# Patient Record
Sex: Female | Born: 1970
Health system: Southern US, Community
[De-identification: ages and names within clinical notes are randomized; demographics above are authoritative.]

## PROBLEM LIST (undated history)

## (undated) DIAGNOSIS — I10 Essential (primary) hypertension: Secondary | ICD-10-CM

## (undated) DIAGNOSIS — M545 Low back pain, unspecified: Secondary | ICD-10-CM

## (undated) DIAGNOSIS — M25519 Pain in unspecified shoulder: Secondary | ICD-10-CM

## (undated) DIAGNOSIS — I8390 Asymptomatic varicose veins of unspecified lower extremity: Secondary | ICD-10-CM

## (undated) DIAGNOSIS — Z8719 Personal history of other diseases of the digestive system: Secondary | ICD-10-CM

## (undated) DIAGNOSIS — N6001 Solitary cyst of right breast: Secondary | ICD-10-CM

## (undated) DIAGNOSIS — K219 Gastro-esophageal reflux disease without esophagitis: Secondary | ICD-10-CM

## (undated) DIAGNOSIS — G8929 Other chronic pain: Secondary | ICD-10-CM

## (undated) HISTORY — DX: Gastro-esophageal reflux disease without esophagitis: K21.9

## (undated) HISTORY — DX: Other chronic pain: G89.29

## (undated) HISTORY — PX: ANTERIOR CRUCIATE LIGAMENT REPAIR: SHX115

## (undated) HISTORY — PX: DILATION AND CURETTAGE OF UTERUS: SHX78

## (undated) HISTORY — DX: Personal history of other diseases of the digestive system: Z87.19

## (undated) HISTORY — DX: Pain in unspecified shoulder: M25.519

## (undated) HISTORY — DX: Asymptomatic varicose veins of unspecified lower extremity: I83.90

## (undated) HISTORY — PX: OTHER SURGICAL HISTORY: SHX169

## (undated) HISTORY — PX: KNEE CARTILAGE SURGERY: SHX688

## (undated) HISTORY — DX: Low back pain, unspecified: M54.50

## (undated) HISTORY — DX: Solitary cyst of right breast: N60.01

## (undated) HISTORY — DX: Low back pain: M54.5

---

## 1995-01-05 HISTORY — PX: TUBAL LIGATION: SHX77

## 2001-01-08 ENCOUNTER — Emergency Department (HOSPITAL_COMMUNITY): Admission: EM | Admit: 2001-01-08 | Discharge: 2001-01-08 | Payer: Self-pay | Admitting: Emergency Medicine

## 2001-01-10 ENCOUNTER — Emergency Department (HOSPITAL_COMMUNITY): Admission: EM | Admit: 2001-01-10 | Discharge: 2001-01-10 | Payer: Self-pay | Admitting: *Deleted

## 2002-08-26 ENCOUNTER — Emergency Department (HOSPITAL_COMMUNITY): Admission: EM | Admit: 2002-08-26 | Discharge: 2002-08-26 | Payer: Self-pay | Admitting: Emergency Medicine

## 2004-01-30 ENCOUNTER — Emergency Department (HOSPITAL_COMMUNITY): Admission: EM | Admit: 2004-01-30 | Discharge: 2004-01-30 | Payer: Self-pay | Admitting: Emergency Medicine

## 2006-04-13 ENCOUNTER — Ambulatory Visit (HOSPITAL_COMMUNITY): Admission: RE | Admit: 2006-04-13 | Discharge: 2006-04-13 | Payer: Self-pay | Admitting: Obstetrics and Gynecology

## 2010-08-18 ENCOUNTER — Other Ambulatory Visit (HOSPITAL_COMMUNITY): Payer: Self-pay | Admitting: Pulmonary Disease

## 2010-08-18 DIAGNOSIS — R300 Dysuria: Secondary | ICD-10-CM

## 2010-08-19 ENCOUNTER — Other Ambulatory Visit (HOSPITAL_COMMUNITY): Payer: Self-pay

## 2010-08-20 ENCOUNTER — Ambulatory Visit (HOSPITAL_COMMUNITY)
Admission: RE | Admit: 2010-08-20 | Discharge: 2010-08-20 | Disposition: A | Discharge status: Home / Self Care | Payer: 59 | Source: Ambulatory Visit | Attending: Pulmonary Disease | Admitting: Pulmonary Disease

## 2010-08-20 DIAGNOSIS — N390 Urinary tract infection, site not specified: Secondary | ICD-10-CM | POA: Insufficient documentation

## 2010-08-20 DIAGNOSIS — R1031 Right lower quadrant pain: Secondary | ICD-10-CM | POA: Insufficient documentation

## 2010-08-20 DIAGNOSIS — R1032 Left lower quadrant pain: Secondary | ICD-10-CM | POA: Insufficient documentation

## 2010-08-20 DIAGNOSIS — M545 Low back pain, unspecified: Secondary | ICD-10-CM | POA: Insufficient documentation

## 2010-08-20 DIAGNOSIS — R300 Dysuria: Secondary | ICD-10-CM

## 2010-09-10 ENCOUNTER — Encounter: Payer: Self-pay | Admitting: Vascular Surgery

## 2010-09-15 ENCOUNTER — Encounter: Payer: Self-pay | Admitting: Vascular Surgery

## 2010-09-16 ENCOUNTER — Ambulatory Visit (INDEPENDENT_AMBULATORY_CARE_PROVIDER_SITE_OTHER): Payer: 59 | Admitting: Vascular Surgery

## 2010-09-16 ENCOUNTER — Encounter: Payer: Self-pay | Admitting: Vascular Surgery

## 2010-09-16 ENCOUNTER — Ambulatory Visit (INDEPENDENT_AMBULATORY_CARE_PROVIDER_SITE_OTHER): Payer: 59 | Admitting: *Deleted

## 2010-09-16 VITALS — BP 142/85 | HR 60 | Resp 16 | Ht 64.0 in | Wt 178.0 lb

## 2010-09-16 DIAGNOSIS — I83893 Varicose veins of bilateral lower extremities with other complications: Secondary | ICD-10-CM

## 2010-09-16 DIAGNOSIS — M79609 Pain in unspecified limb: Secondary | ICD-10-CM

## 2010-09-16 DIAGNOSIS — I83899 Varicose veins of unspecified lower extremities with other complications: Secondary | ICD-10-CM

## 2010-09-16 NOTE — Progress Notes (Signed)
The patient presents today for evaluation of left leg venous pathology. She is concerned regarding painful veins over her left anterior thigh and also large plexus of telangiectasia of her left lateral calf. He has no history of DVT or superficial thrombophlebitis or bleeding. She works in 10-12 hour shift and reports that on the evening following the she does have discomfort over the area of her anterior thigh. He does not have any significant swelling and has only scattered stet spider vein telangiectasia on her right leg. He has sensation is described as aching over her anterior thigh and is relieved with elevation. She has no significant lower extremity swelling.  Past Medical History  Diagnosis Date  . Chest pain   . Chronic pain in shoulder   . GERD (gastroesophageal reflux disease)   . Low back pain   . History of gastrointestinal bleeding   . Varicose veins  painful varicosities left leg    History  Substance Use Topics  . Smoking status: Current Everyday Smoker -- 0.5 packs/day  . Smokeless tobacco: Not on file  . Alcohol Use: No    Family History  Problem Relation Age of Onset  . Cancer Father   . Cancer Maternal Grandmother     No Known Allergies  Current outpatient prescriptions:ibuprofen (ADVIL,MOTRIN) 800 MG tablet, Take 800 mg by mouth as needed.  , Disp: , Rfl: ;  omeprazole (PRILOSEC) 20 MG capsule, Take 20 mg by mouth daily.  , Disp: , Rfl:   BP 142/85  Pulse 60  Resp 16  Ht 5\' 4"  (1.626 m)  Wt 178 lb (80.74 kg)  BMI 30.55 kg/m2  Body mass index is 30.55 kg/(m^2).        Review of systems positive for weight gain, occasional chest pain, gastroesophageal reflux, headache, joint pain all other review of systems negative.  Physical exam: Well-developed well-nourished white female appearing stated age in no acute distress. HEENT is normal. 2+ radial and 2+ dorsalis pedis pulses bilaterally. Musculoskeletal no major forms or cyanosis area neurologic no focal  weakness paresthesias. Skin without ulcers or rashes. Psychiatric normal affect. He does have a large nest of plangent ectasia of the lateral aspect of her left calf. He has lubing reticulator with anterior left thigh.  Venous vascular lab ultrasound: No evidence of DVT and no evidence of left leg venous reflux. She has mild insignificant reflux and a small segment of her left great saphenous vein.  Impression and plan: I discussed the significance of all this at length with [name]. That this does not under any increased risk for more serious venous issues such as DVT. I did explain treatment for both the telangiectasia and bleeding articulators would be potential sclerotherapy if these were causing her a significant concern. I explained that this would not be covered by insurance and a for an estimate of out-of-pocket expense. She will consider this was reassured with the discussion at this was not dangerous.

## 2010-09-23 NOTE — Procedures (Unsigned)
LOWER EXTREMITY VENOUS REFLUX EXAM  INDICATION:  Left leg varicose veins.  EXAM:  Using color-flow imaging and pulse Doppler spectral analysis, the left common femoral, superficial femoral, popliteal, posterior tibial, greater and lesser saphenous veins are evaluated.  There is no evidence suggesting deep venous insufficiency in the left lower extremity.  The left saphenofemoral junction is competent.  The left nontortuous GSV demonstrates reflux of >523milliseconds focally at the mid thigh region.  The left proximal small saphenous vein demonstrates competency.  GSV Diameter (used if found to be incompetent only)                                                   Right     Left Proximal Greater Saphenous Vein                   cm        0.53 cm  Mid thigh                                         cm        0.43 cm  Distal thigh                                      cm        0.26 cm Knee                                              cm        0.35 cm  IMPRESSION:  Left greater saphenous vein reflux is noted, as described above.  ___________________________________________ Larina Earthly, M.D.  CH/MEDQ  D:  09/17/2010  T:  09/17/2010  Job:  [id number]

## 2010-12-01 ENCOUNTER — Other Ambulatory Visit (HOSPITAL_COMMUNITY): Payer: Self-pay | Admitting: Pulmonary Disease

## 2010-12-01 DIAGNOSIS — Z139 Encounter for screening, unspecified: Secondary | ICD-10-CM

## 2010-12-10 ENCOUNTER — Ambulatory Visit (HOSPITAL_COMMUNITY)
Admission: RE | Admit: 2010-12-10 | Discharge: 2010-12-10 | Disposition: A | Discharge status: Home / Self Care | Payer: 59 | Source: Ambulatory Visit | Attending: Pulmonary Disease | Admitting: Pulmonary Disease

## 2010-12-10 DIAGNOSIS — Z1231 Encounter for screening mammogram for malignant neoplasm of breast: Secondary | ICD-10-CM | POA: Insufficient documentation

## 2010-12-10 DIAGNOSIS — Z139 Encounter for screening, unspecified: Secondary | ICD-10-CM

## 2011-02-25 ENCOUNTER — Other Ambulatory Visit (HOSPITAL_COMMUNITY)
Admission: RE | Admit: 2011-02-25 | Discharge: 2011-02-25 | Disposition: A | Discharge status: Home / Self Care | Payer: 59 | Source: Ambulatory Visit | Attending: Obstetrics & Gynecology | Admitting: Obstetrics & Gynecology

## 2011-02-25 DIAGNOSIS — Z01419 Encounter for gynecological examination (general) (routine) without abnormal findings: Secondary | ICD-10-CM | POA: Insufficient documentation

## 2011-04-08 ENCOUNTER — Other Ambulatory Visit (HOSPITAL_COMMUNITY): Payer: Self-pay | Admitting: Pulmonary Disease

## 2011-04-08 ENCOUNTER — Ambulatory Visit (HOSPITAL_COMMUNITY): Payer: 59

## 2011-04-08 ENCOUNTER — Ambulatory Visit (HOSPITAL_COMMUNITY)
Admission: RE | Admit: 2011-04-08 | Discharge: 2011-04-08 | Disposition: A | Discharge status: Home / Self Care | Payer: 59 | Source: Ambulatory Visit | Attending: Pulmonary Disease | Admitting: Pulmonary Disease

## 2011-04-08 DIAGNOSIS — M25519 Pain in unspecified shoulder: Secondary | ICD-10-CM

## 2011-04-22 ENCOUNTER — Encounter: Payer: Self-pay | Admitting: Orthopedic Surgery

## 2011-04-22 ENCOUNTER — Ambulatory Visit (INDEPENDENT_AMBULATORY_CARE_PROVIDER_SITE_OTHER): Payer: 59 | Admitting: Orthopedic Surgery

## 2011-04-22 VITALS — BP 140/80 | Ht 64.0 in | Wt 183.0 lb

## 2011-04-22 DIAGNOSIS — M7551 Bursitis of right shoulder: Secondary | ICD-10-CM

## 2011-04-22 DIAGNOSIS — M25511 Pain in right shoulder: Secondary | ICD-10-CM | POA: Insufficient documentation

## 2011-04-22 DIAGNOSIS — M719 Bursopathy, unspecified: Secondary | ICD-10-CM

## 2011-04-22 DIAGNOSIS — M25519 Pain in unspecified shoulder: Secondary | ICD-10-CM

## 2011-04-22 DIAGNOSIS — M67919 Unspecified disorder of synovium and tendon, unspecified shoulder: Secondary | ICD-10-CM

## 2011-04-22 NOTE — Patient Instructions (Signed)
You will have a therapy visit for home exercise program

## 2011-04-22 NOTE — Progress Notes (Signed)
  Subjective:    Kimberly Fitzpatrick is a 41 y.o. female who presents with 3-1/2 week history of right shoulder pain which came on suddenly without any history of trauma. She complains of sharp dull throbbing stabbing 5/10 intermittent pain which is better after she took a prednisone Dosepak  A consult has been requested by Dr. Kari Baars  No previous history of rotator cuff symptoms she does complain of some catching  She complains of some heartburn her review of systems is otherwise normal  Physical Exam(12) GENERAL: normal development   CDV: pulses are normal   Skin: normal  Lymph: nodes were not palpable/normal  Psychiatric: awake, alert and oriented  Neuro: normal sensation  Examination right shoulder shows full passive and active range of motion. She has grade 5 strength in her rotator cuff. The shoulder stable in abduction and external rotation. There is mild tenderness around a acromial deltoid region. Her impingement sign was positive for Hawkins sign was positive.  Left shoulder full range of motion. Strength is normal. Stability normal. No swelling.     Assessment:    Right Shoulder rotator cuff tendinitis and bursitis    Plan:    Natural history and expected course discussed. Questions answered. Shoulder injection. See procedure note. Physical therapy referral. If she is not in proved after 6 weeks then she is to call to get another appointment possible MRI to be scheduled at that time  Subacromial Shoulder Injection Procedure Note  Shoulder Injection Procedure Note   Pre-operative Diagnosis: right  RC Syndrome  Post-operative Diagnosis: same  Indications: pain   Anesthesia: ethyl chloride   Procedure Details   Verbal consent was obtained for the procedure. The shoulder was prepped withalcohol and the skin was anesthetized. A 20 gauge needle was advanced into the subacromial space through posterior approach without difficulty  The space was then  injected with 3 ml 1% lidocaine and 1 ml of depomedrol. The injection site was cleansed with isopropyl alcohol and a dressing was applied.  Complications:  None; patient tolerated the procedure well.

## 2011-04-26 ENCOUNTER — Other Ambulatory Visit: Payer: Self-pay | Admitting: *Deleted

## 2011-04-26 MED ORDER — HYDROCODONE-ACETAMINOPHEN 5-325 MG PO TABS: 1.0000 | ORAL_TABLET | Freq: Four times a day (QID) | ORAL | Status: AC | PRN

## 2011-05-03 ENCOUNTER — Other Ambulatory Visit: Payer: Self-pay | Admitting: Obstetrics & Gynecology

## 2011-05-04 ENCOUNTER — Encounter (HOSPITAL_COMMUNITY): Payer: Self-pay | Admitting: Pharmacy Technician

## 2011-05-05 ENCOUNTER — Encounter (HOSPITAL_COMMUNITY): Payer: Self-pay

## 2011-05-05 ENCOUNTER — Encounter (HOSPITAL_COMMUNITY)
Admission: RE | Admit: 2011-05-05 | Discharge: 2011-05-05 | Disposition: A | Discharge status: Home / Self Care | Payer: 59 | Source: Ambulatory Visit | Attending: Obstetrics & Gynecology | Admitting: Obstetrics & Gynecology

## 2011-05-05 ENCOUNTER — Other Ambulatory Visit: Payer: Self-pay

## 2011-05-05 HISTORY — DX: Essential (primary) hypertension: I10

## 2011-05-05 LAB — COMPREHENSIVE METABOLIC PANEL
ALT: 17 U/L (ref 0–35)
AST: 17 U/L (ref 0–37)
Albumin: 4.3 g/dL (ref 3.5–5.2)
Alkaline Phosphatase: 52 U/L (ref 39–117)
Chloride: 101 mEq/L (ref 96–112)
Potassium: 4.4 mEq/L (ref 3.5–5.1)
Sodium: 137 mEq/L (ref 135–145)
Total Bilirubin: 0.5 mg/dL (ref 0.3–1.2)
Total Protein: 7.3 g/dL (ref 6.0–8.3)

## 2011-05-05 LAB — CBC
HCT: 44.8 % (ref 36.0–46.0)
MCHC: 34.4 g/dL (ref 30.0–36.0)
Platelets: 272 10*3/uL (ref 150–400)
RDW: 12.8 % (ref 11.5–15.5)
WBC: 9.4 10*3/uL (ref 4.0–10.5)

## 2011-05-05 LAB — URINE MICROSCOPIC-ADD ON

## 2011-05-05 LAB — URINALYSIS, ROUTINE W REFLEX MICROSCOPIC
Bilirubin Urine: NEGATIVE
Glucose, UA: NEGATIVE mg/dL
Ketones, ur: NEGATIVE mg/dL
Nitrite: NEGATIVE
Specific Gravity, Urine: <1.005 — ABNORMAL LOW (ref 1.005–1.030)
pH: 6 (ref 5.0–8.0)

## 2011-05-05 LAB — SURGICAL PCR SCREEN: Staphylococcus aureus: NEGATIVE

## 2011-05-05 LAB — HCG, QUANTITATIVE, PREGNANCY: hCG, Beta Chain, Quant, S: <1 m[IU]/mL (ref <5)

## 2011-05-05 NOTE — Patient Instructions (Addendum)
Your procedure is scheduled on:  05/12/2011  Report to [facility] at   7:00   AM.  Call this number if you have problems the morning of surgery: [phone]   Remember:   Do not drink or eat food:After Midnight.    Clear liquids include soda, tea, black coffee, apple or grape juice, broth.  Take these medicines the morning of surgery with A SIP OF WATER: Benicar   Do not wear jewelry, make-up or nail polish.  Do not wear lotions, powders, or perfumes. You may wear deodorant.  Do not shave 48 hours prior to surgery.  Do not bring valuables to the hospital.  Contacts, dentures or bridgework may not be worn into surgery.  Leave suitcase in the car. After surgery it may be brought to your room.  For patients admitted to the hospital, checkout time is 11:00 AM the day of discharge.   Patients discharged the day of surgery will not be allowed to drive home.  Name and phone number of your driver:   Special Instructions: CHG Shower Shower 2 days before surgery and 1 day before surgery with Hibiclens.   Please read over the following fact sheets that you were given: Pain Booklet, Surgical Site Infection Prevention, Anesthesia Post-op Instructions and Care and Recovery After Surgery

## 2011-05-06 ENCOUNTER — Ambulatory Visit (HOSPITAL_COMMUNITY)
Admission: RE | Admit: 2011-05-06 | Discharge: 2011-05-06 | Disposition: A | Discharge status: Home / Self Care | Payer: 59 | Source: Ambulatory Visit | Attending: Orthopedic Surgery | Admitting: Orthopedic Surgery

## 2011-05-06 DIAGNOSIS — M7551 Bursitis of right shoulder: Secondary | ICD-10-CM

## 2011-05-06 DIAGNOSIS — IMO0001 Reserved for inherently not codable concepts without codable children: Secondary | ICD-10-CM | POA: Insufficient documentation

## 2011-05-06 DIAGNOSIS — M6281 Muscle weakness (generalized): Secondary | ICD-10-CM | POA: Insufficient documentation

## 2011-05-06 DIAGNOSIS — M25519 Pain in unspecified shoulder: Secondary | ICD-10-CM | POA: Insufficient documentation

## 2011-05-06 DIAGNOSIS — M25511 Pain in right shoulder: Secondary | ICD-10-CM

## 2011-05-06 NOTE — Evaluation (Signed)
Occupational Therapy Evaluation  Patient Details  Name: Kimberly Fitzpatrick MRN: [id number] Date of Birth: 04-09-1970  Today's Date: 05/06/2011 Time: 1120-1205 OT Time Calculation (min): 45 min OT Eval 1120-1145 25' Manual THerapy 4098-1191 20' Visit#: 1  of 1   Re-eval:   N/A Assessment Diagnosis: Right Shoulder Bursitis Prior Therapy: No   Past Medical History:  Past Medical History  Diagnosis Date  . Chest pain   . Chronic pain in shoulder   . GERD (gastroesophageal reflux disease)   . Low back pain   . History of gastrointestinal bleeding   . Varicose veins  painful varicosities left leg  . Hypertension    Past Surgical History:  Past Surgical History  Procedure Date  . Knee cartilage surgery right knee 1987   right knee 1999  . Anterior cruciate ligament repair right knee  1999  . Tubal ligation 1997    Subjective Symptoms/Limitations Symptoms: S:  My right shoulder has been hurting for about 4 weeks.  After I had the cortisone injection, it has felt about 80% better. Limitations: History:  Ms. Kimberly Fitzpatrick had bilateral shoulder pain several years ago that dissipated on its own accord.  Approximately 4 weeks ago, she began experiencing increased pain and limited mobility in her right shoulder region .  She consulted with her primary care MD and was referred to Dr. Romeo Apple.  She was given a cortisone injection that has alleviated 80% of her symptoms.  She has been referred to occupational therapy for evaluation and treatment.  She has had an xray which was negative. 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 Leisure: Hobbies-yes (Comment)  Assessment ADL/Vision/Perception ADL ADL Comments: Right hand dominant.  Fastening her bra and putting her right arm into a coat sleeve is painful. Dominant Hand: Right Vision - History Baseline Vision: No visual deficits  Cognition/Observation Cognition Overall Cognitive Status: Appears within functional limits for tasks assessed  Sensation/Coordination/Edema Sensation Light Touch: Appears Intact Coordination Gross Motor Movements are Fluid and Coordinated: Yes Fine Motor Movements are Fluid and Coordinated: Yes  Additional Assessments RUE Assessment RUE Assessment: Within Functional Limits Palpation Palpation: Min-mod fascial restrictions and muscle tightness in her scapular region.     Exercise/Treatments Manual Therapy Manual Therapy: Myofascial release Myofascial Release: MFR and manual stretching to right upper arm and scapular region to decrease pain and increase right arm mobility.  [id number]  Occupational Therapy Assessment and Plan OT Assessment and Plan Clinical Impression Statement: A:  41 year old female presents with increased pain in her right shoulder due to bursitis, causing decreased independence with activiites at home and work involving internal rotation. Pt will benefit from skilled therapeutic intervention in order to improve on the following deficits: Pain;Increased fascial restricitons Rehab Potential: Excellent OT Frequency: Min 1X/week OT Duration:  (one week) OT Treatment/Interventions: Patient/family education;Manual therapy OT Plan: One time visit for educated on HEP completed this date.   Goals Short Term Goals Time to Complete Short Term Goals: 2 weeks Short Term Goal 1: Patient will be educated on a HEP. Short Term Goal 1  Progress: Met  Problem List Patient  Active Problem List  Diagnoses  . Right shoulder pain  . Bursitis of right shoulder    End of Session Activity Tolerance: Patient tolerated treatment well General Behavior During Session: [facility] for tasks performed Cognition: [facility] for tasks performed OT Plan of Care OT Home Exercise Plan: Educated patient on HEP for cervical and shoulder stretches and scapular tband exercises. Consulted and Agree with Plan of Care: Patient  Shirlean Mylar, OTR/L  05/06/2011, 12:54 PM  Physician Documentation Your signature is required to indicate approval of the treatment plan as stated above.  Please sign and either send electronically or make a copy of this report for your files and return this physician signed original.  Please mark one 1.__approve of plan  2. ___approve of plan with the following conditions.   ______________________________                                                          _____________________ Physician Signature                                                                                                             Date

## 2011-05-12 ENCOUNTER — Encounter (HOSPITAL_COMMUNITY): Payer: Self-pay | Admitting: Anesthesiology

## 2011-05-12 ENCOUNTER — Encounter (HOSPITAL_COMMUNITY)
Admission: RE | Disposition: A | Discharge status: Home / Self Care | Payer: Self-pay | Source: Ambulatory Visit | Attending: Obstetrics & Gynecology

## 2011-05-12 ENCOUNTER — Encounter (HOSPITAL_COMMUNITY): Payer: Self-pay | Admitting: *Deleted

## 2011-05-12 ENCOUNTER — Ambulatory Visit (HOSPITAL_COMMUNITY): Payer: 59 | Admitting: Anesthesiology

## 2011-05-12 ENCOUNTER — Ambulatory Visit (HOSPITAL_COMMUNITY)
Admission: RE | Admit: 2011-05-12 | Discharge: 2011-05-12 | Disposition: A | Discharge status: Home / Self Care | Payer: 59 | Source: Ambulatory Visit | Attending: Obstetrics & Gynecology | Admitting: Obstetrics & Gynecology

## 2011-05-12 DIAGNOSIS — Z01812 Encounter for preprocedural laboratory examination: Secondary | ICD-10-CM | POA: Insufficient documentation

## 2011-05-12 DIAGNOSIS — N946 Dysmenorrhea, unspecified: Secondary | ICD-10-CM | POA: Insufficient documentation

## 2011-05-12 DIAGNOSIS — N920 Excessive and frequent menstruation with regular cycle: Secondary | ICD-10-CM | POA: Insufficient documentation

## 2011-05-12 DIAGNOSIS — Z9889 Other specified postprocedural states: Secondary | ICD-10-CM

## 2011-05-12 SURGERY — DILATATION & CURETTAGE/HYSTEROSCOPY WITH THERMACHOICE ABLATION
Anesthesia: General | Wound class: Clean Contaminated

## 2011-05-12 MED ORDER — PROPOFOL 10 MG/ML IV EMUL
INTRAVENOUS | Status: AC
  Filled 2011-05-12: qty 20

## 2011-05-12 MED ORDER — MIDAZOLAM HCL 5 MG/5ML IJ SOLN
INTRAMUSCULAR | Status: DC | PRN
  Administered 2011-05-12: 2 mg via INTRAVENOUS

## 2011-05-12 MED ORDER — SUCCINYLCHOLINE CHLORIDE 20 MG/ML IJ SOLN
INTRAMUSCULAR | Status: AC
  Filled 2011-05-12: qty 1

## 2011-05-12 MED ORDER — DEXTROSE 5 % IV SOLN
INTRAVENOUS | Status: DC | PRN
  Administered 2011-05-12: 22 mL via INTRAVENOUS

## 2011-05-12 MED ORDER — PROPOFOL 10 MG/ML IV BOLUS
INTRAVENOUS | Status: DC | PRN
  Administered 2011-05-12: 150 mg via INTRAVENOUS

## 2011-05-12 MED ORDER — FENTANYL CITRATE 0.05 MG/ML IJ SOLN
INTRAMUSCULAR | Status: AC
  Administered 2011-05-12: 50 ug via INTRAVENOUS
  Filled 2011-05-12: qty 2

## 2011-05-12 MED ORDER — EPHEDRINE SULFATE 50 MG/ML IJ SOLN
INTRAMUSCULAR | Status: DC | PRN
  Administered 2011-05-12: 10 mg via INTRAVENOUS

## 2011-05-12 MED ORDER — SODIUM CHLORIDE 0.9 % IR SOLN
Status: DC | PRN
  Administered 2011-05-12: 3000 mL

## 2011-05-12 MED ORDER — ONDANSETRON HCL 4 MG/2ML IJ SOLN
4.0000 mg | Freq: Once | INTRAMUSCULAR | Status: AC
  Administered 2011-05-12: 4 mg via INTRAVENOUS

## 2011-05-12 MED ORDER — ROCURONIUM BROMIDE 50 MG/5ML IV SOLN
INTRAVENOUS | Status: AC
  Filled 2011-05-12: qty 1

## 2011-05-12 MED ORDER — CEFAZOLIN SODIUM 1-5 GM-% IV SOLN
INTRAVENOUS | Status: AC
  Filled 2011-05-12: qty 50

## 2011-05-12 MED ORDER — KETOROLAC TROMETHAMINE 10 MG PO TABS: 10.0000 mg | ORAL_TABLET | Freq: Three times a day (TID) | ORAL | Status: AC | PRN

## 2011-05-12 MED ORDER — LIDOCAINE HCL 1 % IJ SOLN
INTRAMUSCULAR | Status: DC | PRN
  Administered 2011-05-12: 50 mg via INTRADERMAL

## 2011-05-12 MED ORDER — MIDAZOLAM HCL 2 MG/2ML IJ SOLN
INTRAMUSCULAR | Status: AC
  Filled 2011-05-12: qty 2

## 2011-05-12 MED ORDER — SODIUM CHLORIDE 0.9 % IR SOLN
Status: DC | PRN
  Administered 2011-05-12: 1000 mL

## 2011-05-12 MED ORDER — CEFAZOLIN SODIUM 1-5 GM-% IV SOLN: 1.0000 g | INTRAVENOUS | Status: DC

## 2011-05-12 MED ORDER — ONDANSETRON HCL 8 MG PO TABS: 8.0000 mg | ORAL_TABLET | Freq: Three times a day (TID) | ORAL | Status: AC | PRN

## 2011-05-12 MED ORDER — ONDANSETRON HCL 4 MG/2ML IJ SOLN: 4.0000 mg | Freq: Once | INTRAMUSCULAR | Status: DC | PRN

## 2011-05-12 MED ORDER — FENTANYL CITRATE 0.05 MG/ML IJ SOLN
25.0000 ug | INTRAMUSCULAR | Status: DC | PRN
  Administered 2011-05-12 (×4): 50 ug via INTRAVENOUS

## 2011-05-12 MED ORDER — KETOROLAC TROMETHAMINE 30 MG/ML IJ SOLN
INTRAMUSCULAR | Status: AC
  Administered 2011-05-12: 30 mg via INTRAVENOUS
  Filled 2011-05-12: qty 1

## 2011-05-12 MED ORDER — ROCURONIUM BROMIDE 100 MG/10ML IV SOLN
INTRAVENOUS | Status: DC | PRN
  Administered 2011-05-12: 5 mg via INTRAVENOUS

## 2011-05-12 MED ORDER — FENTANYL CITRATE 0.05 MG/ML IJ SOLN
INTRAMUSCULAR | Status: DC | PRN
  Administered 2011-05-12 (×2): 50 ug via INTRAVENOUS

## 2011-05-12 MED ORDER — CEFAZOLIN SODIUM 1-5 GM-% IV SOLN
INTRAVENOUS | Status: DC | PRN
  Administered 2011-05-12: 1 g via INTRAVENOUS

## 2011-05-12 MED ORDER — LIDOCAINE HCL (PF) 1 % IJ SOLN
INTRAMUSCULAR | Status: AC
  Filled 2011-05-12: qty 5

## 2011-05-12 MED ORDER — ONDANSETRON HCL 4 MG/2ML IJ SOLN
INTRAMUSCULAR | Status: AC
  Administered 2011-05-12: 4 mg via INTRAVENOUS
  Filled 2011-05-12: qty 2

## 2011-05-12 MED ORDER — KETOROLAC TROMETHAMINE 30 MG/ML IJ SOLN
30.0000 mg | Freq: Once | INTRAMUSCULAR | Status: AC
  Administered 2011-05-12: 30 mg via INTRAVENOUS

## 2011-05-12 MED ORDER — LACTATED RINGERS IV SOLN
INTRAVENOUS | Status: DC
  Administered 2011-05-12: 1000 mL via INTRAVENOUS

## 2011-05-12 MED ORDER — SUCCINYLCHOLINE CHLORIDE 20 MG/ML IJ SOLN
INTRAMUSCULAR | Status: DC | PRN
  Administered 2011-05-12: 160 mg via INTRAVENOUS

## 2011-05-12 MED ORDER — MIDAZOLAM HCL 2 MG/2ML IJ SOLN
1.0000 mg | INTRAMUSCULAR | Status: DC | PRN
Start: 2011-05-12 — End: 2011-05-12
  Administered 2011-05-12 (×2): 2 mg via INTRAVENOUS

## 2011-05-12 MED ORDER — MIDAZOLAM HCL 2 MG/2ML IJ SOLN
INTRAMUSCULAR | Status: AC
  Administered 2011-05-12: 2 mg via INTRAVENOUS
  Filled 2011-05-12: qty 2

## 2011-05-12 SURGICAL SUPPLY — 28 items
BAG DECANTER FOR FLEXI CONT (MISCELLANEOUS) ×2 IMPLANT
BAG HAMPER (MISCELLANEOUS) ×2 IMPLANT
CATH THERMACHOICE III (CATHETERS) ×2 IMPLANT
CLOTH BEACON ORANGE TIMEOUT ST (SAFETY) ×2 IMPLANT
COVER SURGICAL LIGHT HANDLE (MISCELLANEOUS) ×4 IMPLANT
FORMALIN 10 PREFIL 120ML (MISCELLANEOUS) ×2 IMPLANT
GAUZE SPONGE 4X4 16PLY XRAY LF (GAUZE/BANDAGES/DRESSINGS) ×2 IMPLANT
GLOVE BIOGEL PI IND STRL 8 (GLOVE) ×1 IMPLANT
GLOVE BIOGEL PI INDICATOR 8 (GLOVE) ×1
GLOVE ECLIPSE 6.5 STRL STRAW (GLOVE) ×2 IMPLANT
GLOVE ECLIPSE 8.0 STRL XLNG CF (GLOVE) ×2 IMPLANT
GLOVE INDICATOR 7.0 STRL GRN (GLOVE) ×4 IMPLANT
GOWN STRL REIN XL XLG (GOWN DISPOSABLE) ×4 IMPLANT
INST SET HYSTEROSCOPY (KITS) ×2 IMPLANT
IV D5W 500ML (IV SOLUTION) ×2 IMPLANT
IV NS IRRIG 3000ML ARTHROMATIC (IV SOLUTION) ×2 IMPLANT
KIT ROOM TURNOVER APOR (KITS) ×2 IMPLANT
MANIFOLD NEPTUNE II (INSTRUMENTS) ×2 IMPLANT
MARKER SKIN DUAL TIP RULER LAB (MISCELLANEOUS) ×2 IMPLANT
NS IRRIG 1000ML POUR BTL (IV SOLUTION) ×2 IMPLANT
PACK BASIC III (CUSTOM PROCEDURE TRAY) ×1
PACK SRG BSC III STRL LF ECLPS (CUSTOM PROCEDURE TRAY) ×1 IMPLANT
PAD ARMBOARD 7.5X6 YLW CONV (MISCELLANEOUS) ×2 IMPLANT
PAD TELFA 3X4 1S STER (GAUZE/BANDAGES/DRESSINGS) ×2 IMPLANT
SET BASIN LINEN APH (SET/KITS/TRAYS/PACK) ×2 IMPLANT
SET IRRIG Y TYPE TUR BLADDER L (SET/KITS/TRAYS/PACK) ×2 IMPLANT
SHEET LAVH (DRAPES) ×2 IMPLANT
YANKAUER SUCT BULB TIP 10FT TU (MISCELLANEOUS) ×2 IMPLANT

## 2011-05-12 NOTE — Transfer of Care (Signed)
Immediate Anesthesia Transfer of Care Note  Patient: Kimberly Fitzpatrick  Procedure(s) Performed: Procedure(s) (LRB): DILATATION & CURETTAGE/HYSTEROSCOPY WITH THERMACHOICE ABLATION (N/A)  Patient Location: PACU  Anesthesia Type: General  Level of Consciousness: awake, alert  and patient cooperative  Airway & Oxygen Therapy: Patient Spontanous Breathing and Patient connected to face mask oxygen  Post-op Assessment: Report given to PACU RN, Post -op Vital signs reviewed and stable and Patient moving all extremities  Post vital signs: Reviewed and stable  Complications: No apparent anesthesia complications

## 2011-05-12 NOTE — Anesthesia Preprocedure Evaluation (Addendum)
Anesthesia Evaluation  Patient identified by MRN, date of birth, ID band Patient awake    Reviewed: Allergy & Precautions, H&P , NPO status , Patient's Chart, lab work & pertinent test results  History of Anesthesia Complications Negative for: history of anesthetic complications  Airway Mallampati: II TM Distance: >3 FB     Dental  (+) Teeth Intact and Poor Dentition   Pulmonary Current Smoker,  breath sounds clear to auscultation        Cardiovascular hypertension, Pt. on medications Rhythm:Regular     Neuro/Psych    GI/Hepatic GERD-  Medicated and Controlled,  Endo/Other    Renal/GU      Musculoskeletal  (+) Arthritis - (chronic LBP),   Abdominal   Peds  Hematology   Anesthesia Other Findings   Reproductive/Obstetrics                           Anesthesia Physical Anesthesia Plan  ASA: II  Anesthesia Plan: General   Post-op Pain Management:    Induction: Intravenous, Rapid sequence and Cricoid pressure planned  Airway Management Planned: Oral ETT  Additional Equipment:   Intra-op Plan:   Post-operative Plan: Extubation in OR  Informed Consent: I have reviewed the patients History and Physical, chart, labs and discussed the procedure including the risks, benefits and alternatives for the proposed anesthesia with the patient or authorized representative who has indicated his/her understanding and acceptance.     Plan Discussed with:   Anesthesia Plan Comments:         Anesthesia Quick Evaluation

## 2011-05-12 NOTE — H&P (Signed)
Kimberly Fitzpatrick is an 41 y.o. female status post tubal ligation with increasing menometrorrhagia and dysmenorrhea.  Responded to megestrol.  Sonogram normal.  No pain other than with menses.  No dyspareunia.  For endometrial ablation.     Past Medical History  Diagnosis Date  . Chest pain   . Chronic pain in shoulder   . GERD (gastroesophageal reflux disease)   . Low back pain   . History of gastrointestinal bleeding   . Varicose veins  painful varicosities left leg  . Hypertension     Past Surgical History  Procedure Date  . Knee cartilage surgery right knee 1987   right knee 1999  . Anterior cruciate ligament repair right knee  1999  . Tubal ligation 1997    Family History  Problem Relation Age of Onset  . Cancer Father   . Cancer Maternal Grandmother     Social History:  reports that she has been smoking.  She does not have any smokeless tobacco history on file. She reports that she does not drink alcohol or use illicit drugs.  Allergies: No Known Allergies  Prescriptions prior to admission  Medication Sig Dispense Refill  . gabapentin (NEURONTIN) 300 MG capsule Take 300 mg by mouth 3 (three) times daily.      Marland Kitchen HYDROcodone-acetaminophen (NORCO) 5-325 MG per tablet Take 1 tablet by mouth every 6 (six) hours as needed. For pain      . megestrol (MEGACE) 40 MG tablet Take 40 mg by mouth daily.      Marland Kitchen olmesartan (BENICAR) 40 MG tablet Take 40 mg by mouth daily.      Marland Kitchen omeprazole (PRILOSEC) 20 MG capsule Take 20 mg by mouth daily.          ROS  Review of Systems  Constitutional: Negative for fever, chills, weight loss, malaise/fatigue and diaphoresis.  HENT: Negative for hearing loss, ear pain, nosebleeds, congestion, sore throat, neck pain, tinnitus and ear discharge.   Eyes: Negative for blurred vision, double vision, photophobia, pain, discharge and redness.  Respiratory: Negative for cough, hemoptysis, sputum production, shortness of breath, wheezing and stridor.     Cardiovascular: Negative for chest pain, palpitations, orthopnea, claudication, leg swelling and PND.  Gastrointestinal: Negative for abdominal pain. Negative for heartburn, nausea, vomiting, diarrhea, constipation, blood in stool and melena.  Genitourinary: Negative for dysuria, urgency, frequency, hematuria and flank pain.  Musculoskeletal: Negative for myalgias, back pain, joint pain and falls.  Skin: Negative for itching and rash.  Neurological: Negative for dizziness, tingling, tremors, sensory change, speech change, focal weakness, seizures, loss of consciousness, weakness and headaches.  Endo/Heme/Allergies: Negative for environmental allergies and polydipsia. Does not bruise/bleed easily.  Psychiatric/Behavioral: Negative for depression, suicidal ideas, hallucinations, memory loss and substance abuse. The patient is not nervous/anxious and does not have insomnia.      Blood pressure 136/82, pulse 72, temperature 98.1 F (36.7 C), temperature source Oral, resp. rate 23, last menstrual period 02/16/2011, SpO2 95.00%. Physical Exam Physical Exam  Vitals reviewed. Constitutional: She is oriented to person, place, and time. She appears well-developed and well-nourished.  HENT:  Head: Normocephalic and atraumatic.  Right Ear: External ear normal.  Left Ear: External ear normal.  Nose: Nose normal.  Mouth/Throat: Oropharynx is clear and moist.  Eyes: Conjunctivae and EOM are normal. Pupils are equal, round, and reactive to light. Right eye exhibits no discharge. Left eye exhibits no discharge. No scleral icterus.  Neck: Normal range of motion. Neck supple. No tracheal deviation  present. No thyromegaly present.  Cardiovascular: Normal rate, regular rhythm, normal heart sounds and intact distal pulses.  Exam reveals no gallop and no friction rub.   No murmur heard. Respiratory: Effort normal and breath sounds normal. No respiratory distress. She has no wheezes. She has no rales. She  exhibits no tenderness.  GI: Soft. Bowel sounds are normal. She exhibits no distension and no mass. There is tenderness. There is no rebound and no guarding.  Genitourinary:       Vulva is normal without lesions Vagina is pink moist without discharge Cervix normal in appearance and pap is normal Uterus is normal by sonogram Adnexa is negative with normal sized ovaries by sonogram  Musculoskeletal: Normal range of motion. She exhibits no edema and no tenderness.  Neurological: She is alert and oriented to person, place, and time. She has normal reflexes. She displays normal reflexes. No cranial nerve deficit. She exhibits normal muscle tone. Coordination normal.  Skin: Skin is warm and dry. No rash noted. No erythema. No pallor.  Psychiatric: She has a normal mood and affect. Her behavior is normal. Judgment and thought content normal.   Recent Results (from the past 336 hour(s))  URINALYSIS, ROUTINE W REFLEX MICROSCOPIC   Collection Time   05/05/11 11:49 AM      Component Value Range   Color, Urine YELLOW  YELLOW    APPearance CLEAR  CLEAR    Specific Gravity, Urine <1.005 (*) 1.005 - 1.030    pH 6.0  5.0 - 8.0    Glucose, UA NEGATIVE  NEGATIVE (mg/dL)   Hgb urine dipstick TRACE (*) NEGATIVE    Bilirubin Urine NEGATIVE  NEGATIVE    Ketones, ur NEGATIVE  NEGATIVE (mg/dL)   Protein, ur NEGATIVE  NEGATIVE (mg/dL)   Urobilinogen, UA 0.2  0.0 - 1.0 (mg/dL)   Nitrite NEGATIVE  NEGATIVE    Leukocytes, UA TRACE (*) NEGATIVE   URINE MICROSCOPIC-ADD ON   Collection Time   05/05/11 11:49 AM      Component Value Range   Squamous Epithelial / LPF FEW (*) RARE    WBC, UA 3-6  <3 (WBC/hpf)   RBC / HPF 0-2  <3 (RBC/hpf)   Bacteria, UA FEW (*) RARE   CBC   Collection Time   05/05/11 12:30 PM      Component Value Range   WBC 9.4  4.0 - 10.5 (K/uL)   RBC 5.03  3.87 - 5.11 (MIL/uL)   Hemoglobin 15.4 (*) 12.0 - 15.0 (g/dL)   HCT 13.0  86.5 - 78.4 (%)   MCV 89.1  78.0 - 100.0 (fL)   MCH 30.6  26.0  - 34.0 (pg)   MCHC 34.4  30.0 - 36.0 (g/dL)   RDW 69.6  29.5 - 28.4 (%)   Platelets 272  150 - 400 (K/uL)  COMPREHENSIVE METABOLIC PANEL   Collection Time   05/05/11 12:30 PM      Component Value Range   Sodium 137  135 - 145 (mEq/L)   Potassium 4.4  3.5 - 5.1 (mEq/L)   Chloride 101  96 - 112 (mEq/L)   CO2 26  19 - 32 (mEq/L)   Glucose, Bld 97  70 - 99 (mg/dL)   BUN 14  6 - 23 (mg/dL)   Creatinine, Ser 1.32  0.50 - 1.10 (mg/dL)   Calcium 44.0  8.4 - 10.5 (mg/dL)   Total Protein 7.3  6.0 - 8.3 (g/dL)   Albumin 4.3  3.5 - 5.2 (g/dL)  AST 17  0 - 37 (U/L)   ALT 17  0 - 35 (U/L)   Alkaline Phosphatase 52  39 - 117 (U/L)   Total Bilirubin 0.5  0.3 - 1.2 (mg/dL)   GFR calc non Af Amer 87 (*) >90 (mL/min)   GFR calc Af Amer >90  >90 (mL/min)  HCG, QUANTITATIVE, PREGNANCY   Collection Time   05/05/11 12:30 PM      Component Value Range   hCG, Beta Chain, Quant, S <1  <5 (mIU/mL)  SURGICAL PCR SCREEN   Collection Time   05/05/11 12:36 PM      Component Value Range   MRSA, PCR NEGATIVE  NEGATIVE    Staphylococcus aureus NEGATIVE  NEGATIVE      Assessment/Plan: 1.  Menometrorrhagia 2.  Dysmenorrhea  Patient for hysteroscopy curettage ablation she understands risks and risk of failure.   Ethyl Vila H 05/12/2011, 9:40 AM

## 2011-05-12 NOTE — Discharge Instructions (Signed)
Endometrial Ablation Endometrial ablation removes the lining of the uterus (endometrium). It is usually a same day, outpatient treatment. Ablation helps avoid major surgery (such as a hysterectomy). A hysterectomy is removal of the cervix and uterus. Endometrial ablation has less risk and complications, has a shorter recovery period and is less expensive. After endometrial ablation, most women will have little or no menstrual bleeding. You may not keep your fertility. Pregnancy is no longer likely after this procedure but if you are pre-menopausal, you still need to use a reliable method of birth control following the procedure because pregnancy can occur. REASONS TO HAVE THE PROCEDURE MAY INCLUDE:  Heavy periods.   Bleeding that is causing anemia.   Anovulatory bleeding, very irregular, bleeding.   Bleeding submucous fibroids (on the lining inside the uterus) if they are smaller than 3 centimeters.  REASONS NOT TO HAVE THE PROCEDURE MAY INCLUDE:  You wish to have more children.   You have a pre-cancerous or cancerous problem. The cause of any abnormal bleeding must be diagnosed before having the procedure.   You have pain coming from the uterus.   You have a submucus fibroid larger than 3 centimeters.   You recently had a baby.   You recently had an infection in the uterus.   You have a severe retro-flexed, tipped uterus and cannot insert the instrument to do the ablation.   You had a Cesarean section or deep major surgery on the uterus.   The inner cavity of the uterus is too large for the endometrial ablation instrument.  RISKS AND COMPLICATIONS   Perforation of the uterus.   Bleeding.   Infection of the uterus, bladder or vagina.   Injury to surrounding organs.   Cutting the cervix.   An air bubble to the lung (air embolus).   Pregnancy following the procedure.   Failure of the procedure to help the problem requiring hysterectomy.   Decreased ability to diagnose  cancer in the lining of the uterus.  BEFORE THE PROCEDURE  The lining of the uterus must be tested to make sure there is no pre-cancerous or cancer cells present.   Medications may be given to make the lining of the uterus thinner.   Ultrasound may be used to evaluate the size and look for abnormalities of the uterus.   Future pregnancy is not desired.  PROCEDURE  There are different ways to destroy the lining of the uterus.   Resectoscope - radio frequency-alternating electric current is the most common one used.   Cryotherapy - freezing the lining of the uterus.   Heated Free Liquid - heated salt (saline) solution inserted into the uterus.   Microwave - uses high energy microwaves in the uterus.   Thermal Balloon - a catheter with a balloon tip is inserted into the uterus and filled with heated fluid.  Your caregiver will talk with you about the method used in this clinic. They will also instruct you on the pros and cons of the procedure. Endometrial ablation is performed along with a procedure called operative hysteroscopy. A narrow viewing tube is inserted through the birth canal (vagina) and through the cervix into the uterus. A tiny camera attached to the viewing tube (hysteroscope) allows the uterine cavity to be shown on a TV monitor during surgery. Your uterus is filled with a harmless liquid to make the procedure easier. The lining of the uterus is then removed. The lining can also be removed with a resectoscope which allows your surgeon   to cut away the lining of the uterus under direct vision. Usually, you will be able to go home within an hour after the procedure. HOME CARE INSTRUCTIONS   Do not drive for 24 hours.   No tampons, douching or intercourse for 2 weeks or until your caregiver approves.   Rest at home for 24 to 48 hours. You may then resume normal activities unless told differently by your caregiver.   Take your temperature two times a day for 4 days, and record  it.   Take any medications your caregiver has ordered, as directed.   Use some form of contraception if you are pre-menopausal and do not want to get pregnant.  Bleeding after the procedure is normal. It varies from light spotting and mildly watery to bloody discharge for 4 to 6 weeks. You may also have mild cramping. Only take over-the-counter or prescription medicines for pain, discomfort, or fever as directed by your caregiver. Do not use aspirin, as this may aggravate bleeding. Frequent urination during the first 24 hours is normal. You will not know how effective your surgery is until at least 3 months after the surgery. SEEK IMMEDIATE MEDICAL CARE IF:   Bleeding is heavier than a normal menstrual cycle.   An oral temperature above 102 F (38.9 C) develops.   You have increasing cramps or pains not relieved with medication or develop belly (abdominal) pain which does not seem to be related to the same area of earlier cramping and pain.   You are light headed, weak or have fainting episodes.   You develop pain in the shoulder strap areas.   You have chest or leg pain.   You have abnormal vaginal discharge.   You have painful urination.  Document Released: 10/31/2003 Document Revised: 12/10/2010 Document Reviewed: 01/28/2007 ExitCare Patient Information 2012 [employer]. 

## 2011-05-12 NOTE — Anesthesia Postprocedure Evaluation (Signed)
  Anesthesia Post-op Note  Patient: Kimberly Fitzpatrick  Procedure(s) Performed: Procedure(s) (LRB): DILATATION & CURETTAGE/HYSTEROSCOPY WITH THERMACHOICE ABLATION (N/A)  Patient Location: PACU  Anesthesia Type: General  Level of Consciousness: awake, alert , oriented and patient cooperative  Airway and Oxygen Therapy: Patient Spontanous Breathing  Post-op Pain: 3 /10, mild  Post-op Assessment: Post-op Vital signs reviewed, Patient's Cardiovascular Status Stable, Respiratory Function Stable, Patent Airway, No signs of Nausea or vomiting and Pain level controlled  Post-op Vital Signs: Reviewed and stable  Complications: No apparent anesthesia complications

## 2011-05-12 NOTE — Anesthesia Procedure Notes (Signed)
Procedure Name: Intubation Date/Time: 05/12/2011 10:15 AM Performed by: Despina Hidden Pre-anesthesia Checklist: Suction available, Emergency Drugs available, Patient being monitored and Patient identified Patient Re-evaluated:Patient Re-evaluated prior to inductionOxygen Delivery Method: Circle system utilized Preoxygenation: Pre-oxygenation with 100% oxygen Intubation Type: IV induction, Cricoid Pressure applied and Rapid sequence Ventilation: Mask ventilation without difficulty Laryngoscope Size: 3 and Mac Grade View: Grade I Tube type: Oral Tube size: 7.0 mm Number of attempts: 1 Airway Equipment and Method: Stylet Placement Confirmation: breath sounds checked- equal and bilateral,  ETT inserted through vocal cords under direct vision and positive ETCO2 Secured at: 22 cm Tube secured with: Tape Dental Injury: Teeth and Oropharynx as per pre-operative assessment

## 2011-05-12 NOTE — Op Note (Signed)
Preoperative diagnosis: Menometrorrhagia                                        Dysmenorrhea   Postoperative diagnoses: Same as above   Procedure: Hysteroscopy, uterine curettage, endometrial ablation  Surgeon: Despina Hidden MD  Anesthesia: Laryngeal mask airway  Findings: The endometrium was normal. There were no fibroid or other abnormalities.  Description of operation: The patient was taken to the operating room and placed in the supine position. She underwent general anesthesia using the laryngeal mask airway. She was placed in the dorsal lithotomy position and prepped and draped in the usual sterile fashion. A Graves speculum was placed and the anterior cervical lip was grasped with a single-tooth tenaculum. The cervix was dilated serially to allow passage of the hysteroscope. Diagnostic hysteroscopy was performed and was found to be normal. A vigorous uterine curettage was then performed and all tissue sent to pathology for evaluation. The ThermaChoice 3 endometrial ablation balloon was then used were 22 cc of D5W was required to maintain a pressure of 190-200 mm of mercury throughout the procedure. Toatl therapy time was 9:58.  All of the equipment worked well throughout the procedure. All of the fluid was returned at the end of the procedure. The patient was awakened from anesthesia and taken to the recovery room in good stable condition all counts were correct. She received 1 g of Ancef and 30 mg of Toradol preoperatively. She will be discharged from the recovery room and followed up in the office in 2 weeks.  Kimberly Fitzpatrick H 10:51 AM 05/12/2011

## 2012-09-27 ENCOUNTER — Ambulatory Visit (HOSPITAL_COMMUNITY)
Admission: RE | Admit: 2012-09-27 | Discharge: 2012-09-27 | Disposition: A | Discharge status: Home / Self Care | Payer: 59 | Source: Ambulatory Visit | Attending: Pulmonary Disease | Admitting: Pulmonary Disease

## 2012-09-27 ENCOUNTER — Other Ambulatory Visit (HOSPITAL_COMMUNITY): Payer: Self-pay | Admitting: Pulmonary Disease

## 2012-09-27 DIAGNOSIS — M25569 Pain in unspecified knee: Secondary | ICD-10-CM | POA: Insufficient documentation

## 2012-09-27 DIAGNOSIS — G8929 Other chronic pain: Secondary | ICD-10-CM

## 2013-01-08 ENCOUNTER — Other Ambulatory Visit (HOSPITAL_COMMUNITY): Payer: Self-pay | Admitting: Pulmonary Disease

## 2013-01-08 DIAGNOSIS — Z139 Encounter for screening, unspecified: Secondary | ICD-10-CM

## 2013-01-15 ENCOUNTER — Ambulatory Visit (HOSPITAL_COMMUNITY)
Admission: RE | Admit: 2013-01-15 | Discharge: 2013-01-15 | Disposition: A | Discharge status: Home / Self Care | Payer: 59 | Source: Ambulatory Visit | Attending: Pulmonary Disease | Admitting: Pulmonary Disease

## 2013-01-15 DIAGNOSIS — Z139 Encounter for screening, unspecified: Secondary | ICD-10-CM

## 2013-01-15 DIAGNOSIS — Z1231 Encounter for screening mammogram for malignant neoplasm of breast: Secondary | ICD-10-CM | POA: Insufficient documentation

## 2013-03-21 ENCOUNTER — Inpatient Hospital Stay: Payer: 59 | Admitting: Adult Health

## 2014-01-31 ENCOUNTER — Ambulatory Visit (INDEPENDENT_AMBULATORY_CARE_PROVIDER_SITE_OTHER): Payer: 59

## 2014-01-31 ENCOUNTER — Encounter: Payer: Self-pay | Admitting: Orthopedic Surgery

## 2014-01-31 ENCOUNTER — Ambulatory Visit (INDEPENDENT_AMBULATORY_CARE_PROVIDER_SITE_OTHER): Payer: 59 | Admitting: Orthopedic Surgery

## 2014-01-31 VITALS — BP 174/102 | Ht 64.0 in | Wt 188.0 lb

## 2014-01-31 DIAGNOSIS — M25511 Pain in right shoulder: Secondary | ICD-10-CM

## 2014-01-31 DIAGNOSIS — M75101 Unspecified rotator cuff tear or rupture of right shoulder, not specified as traumatic: Secondary | ICD-10-CM

## 2014-02-01 ENCOUNTER — Encounter: Payer: Self-pay | Admitting: Orthopedic Surgery

## 2014-02-01 NOTE — Progress Notes (Signed)
Patient ID: Kimberly Fitzpatrick, female   DOB: February 08, 1970, 44 y.o.   MRN: [id number] Chief Complaint  Patient presents with  . Shoulder Pain    Right shoulder pain, no injury.    44 year old female previous history of right rotator cuff syndrome treated with subacromial injection did well for approximately 2-1/2 years. She then began having pain over the right shoulder joint, difficulty sleeping at night, painful [place] elevation and weakness in the right shoulder without any history of trauma  She is here for repeat evaluation  Review of systems reveals no neck pain no tenderness in the cervical spine region no lower back pain. No numbness or tingling.  Past Medical History  Diagnosis Date  . Chest pain   . Chronic pain in shoulder   . GERD (gastroesophageal reflux disease)   . Low back pain   . History of gastrointestinal bleeding   . Varicose veins  painful varicosities left leg  . Hypertension      BP 174/102 mmHg  Ht 5\' 4"  (1.626 m)  Wt 188 lb (85.276 kg)  BMI 32.25 kg/m2 Normal development grooming and hygiene. She is awake alert and oriented 3. Gait and station are normal. Cervical spine is nontender with full range of motion.  Left shoulder motion is normal stability tests were normal muscle tone and strength were normal in grade 5  Right shoulder shows tenderness in the subacromial joint lateral deltoid area. Painful range of motion past 120 of flexion with full passive flexion decreased internal rotation with pain and tightness in the front of the shoulder joint. An abduction external rotation she is stable. Rotator cuff strength grade 5.  Axillary lymph nodes are normal  Normal sensation in the right hand good distal pulse in the radial and ulnar artery  X-rays show cyst formation in the greater tuberosity region glenohumeral joint is normal acromion is type II graft impression rotator cuff syndrome  Repeat injection  If no improvement after 2 weeks please call the  office to arrange further evaluation     Procedure note the subacromial injection shoulder RIGHT  Verbal consent was obtained to inject the  RIGHT   Shoulder  Timeout was completed to confirm the injection site is a subacromial space of the  RIGHT  shoulder   Medication used Depo-Medrol 40 mg and lidocaine 1% 3 cc  Anesthesia was provided by ethyl chloride  The injection was performed in the RIGHT  posterior subacromial space. After pinning the skin with alcohol and anesthetized the skin with ethyl chloride the subacromial space was injected using a 20-gauge needle. There were no complications  Sterile dressing was applied.

## 2014-05-07 ENCOUNTER — Other Ambulatory Visit (HOSPITAL_COMMUNITY): Payer: Self-pay | Admitting: Pulmonary Disease

## 2014-05-07 DIAGNOSIS — Z1231 Encounter for screening mammogram for malignant neoplasm of breast: Secondary | ICD-10-CM

## 2014-05-20 ENCOUNTER — Ambulatory Visit (HOSPITAL_COMMUNITY)
Admission: RE | Admit: 2014-05-20 | Discharge: 2014-05-20 | Disposition: A | Discharge status: Home / Self Care | Payer: 59 | Source: Ambulatory Visit | Attending: Pulmonary Disease | Admitting: Pulmonary Disease

## 2014-05-20 DIAGNOSIS — Z1231 Encounter for screening mammogram for malignant neoplasm of breast: Secondary | ICD-10-CM | POA: Insufficient documentation

## 2014-12-06 ENCOUNTER — Ambulatory Visit (HOSPITAL_COMMUNITY): Payer: Managed Care, Other (non HMO) | Attending: Physical Medicine and Rehabilitation | Admitting: Physical Therapy

## 2014-12-06 DIAGNOSIS — R262 Difficulty in walking, not elsewhere classified: Secondary | ICD-10-CM | POA: Insufficient documentation

## 2014-12-06 DIAGNOSIS — M5442 Lumbago with sciatica, left side: Secondary | ICD-10-CM | POA: Insufficient documentation

## 2014-12-06 NOTE — Patient Instructions (Signed)
Bridging    Slowly raise buttocks from floor, keeping stomach tight. Repeat __10__ times per set. Do ___1_ sets per session. Do __3__ sessions per day.  http://orth.exer.us/1096   Copyright  VHI. All rights reserved.  Straight Leg Raise (Prone)    Abdomen and head supported, keep left knee locked and raise leg at hip. Avoid arching low back. Repeat _10__ times per set. Do ___1_ sets per session. Do __3__ sessions per day.  http://orth.exer.us/1112   Copyright  VHI. All rights reserved.

## 2014-12-06 NOTE — Therapy (Signed)
[facility] [place], [place], [address] Phone: [phone]   Fax:  [phone]  Physical Therapy Evaluation  Patient Details  Name: Kimberly Fitzpatrick MRN: [id number] Date of Birth: 1970-01-23 Referring Provider: Darnelle Going  Encounter Date: 12/06/2014      PT End of Session - 12/06/14 1601    Visit Number 1   Number of Visits 4   Date for PT Re-Evaluation 01/05/15   Authorization Type [employer]   Authorization - Visit Number 1   Authorization - Number of Visits 4   PT Start Time 1520   PT Stop Time 1559   PT Time Calculation (min) 39 min   Activity Tolerance Patient tolerated treatment well      Past Medical History  Diagnosis Date  . Chest pain   . Chronic pain in shoulder   . GERD (gastroesophageal reflux disease)   . Low back pain   . History of gastrointestinal bleeding   . Varicose veins  painful varicosities left leg  . Hypertension     Past Surgical History  Procedure Laterality Date  . Knee cartilage surgery  right knee 1987   right knee 1999  . Anterior cruciate ligament repair  right knee  1999  . Tubal ligation  1997    There were no vitals filed for this visit.  Visit Diagnosis:  Left-sided low back pain with left-sided sciatica - Plan: PT plan of care cert/re-cert  Difficulty walking - Plan: PT plan of care cert/re-cert      Subjective Assessment - 12/06/14 1524    Subjective Ms. Rosana Berger states that she has been having some back and Lt hip pain for years.   She had an injection in her Lt SI last week.  She states that she is 50% better now compared to what it was.  She states at this time she is painfree.     How long can you sit comfortably? able to sit for only short periods of time    How long can you stand comfortably? Pt can stand for 5-10 minutes    How long can you walk comfortably? walking is fine sometimes other times it bothers her .    Currently in Pain? No/denies  Worst pain she has  had has been a 9/10             [facility] PT Assessment - 12/06/14 0001    Assessment   Medical Diagnosis Lt SI dysfunction   Referring Provider Ibeezebo   Onset Date/Surgical Date 11/21/14   Next MD Visit 12/24/2014   Prior Therapy not for this    Balance Screen   Has the patient fallen in the past 6 months No   Has the patient had a decrease in activity level because of a fear of falling?  No   Is the patient reluctant to leave their home because of a fear of falling?  No   Prior Function   Level of Independence Independent   Vocation Full time employment   Vocation Requirements on feet all day    Leisure none   Cognition   Overall Cognitive Status Within Functional Limits for tasks assessed   Observation/Other Assessments   Focus on Therapeutic Outcomes (FOTO)  66   Posture/Postural Control   Posture/Postural Control Postural limitations   Postural Limitations Decreased lumbar lordosis;Decreased thoracic kyphosis   Posture Comments Lt iliac crest high, Lt PSIS high; Lt ASIS low indicative of anterior rotation  ROM / Strength   AROM / PROM / Strength AROM;Strength   AROM   AROM Assessment Site Lumbar   Lumbar Flexion decreased 20 %    Lumbar Extension decreased 30%    Strength   Strength Assessment Site Hip;Knee;Ankle   Right/Left Hip Right;Left   Right Hip Flexion 5/5   Right Hip Extension 4/5   Right Hip ABduction 5/5   Left Hip Flexion 5/5   Left Hip Extension 4-/5   Left Hip ABduction 5/5   Right/Left Knee Right;Left   Right Knee Extension 5/5   Left Knee Extension 5/5   Right/Left Ankle Right;Left   Right Ankle Dorsiflexion 5/5   Left Ankle Dorsiflexion 5/5   Palpation   SI assessment  --  Lt inominant is anteriorly rotated.                    [facility] Adult PT Treatment/Exercise - 12/06/14 0001    Exercises   Exercises Lumbar   Lumbar Exercises: Supine   Bridge 10 reps   Lumbar Exercises: Prone   Straight Leg Raise 5 reps   Manual Therapy    Manual Therapy Muscle Energy Technique   Muscle Energy Technique for LT SI                 PT Education - 12/06/14 1600    Education provided Yes   Education Details SI self mobilization and gluteal strengthening    Person(s) Educated Patient   Methods Explanation;Verbal cues;Handout   Comprehension Verbalized understanding;Returned demonstration          PT Short Term Goals - 12/06/14 1615    PT SHORT TERM GOAL #1   Title Pt to be I in HEP to keep SI in proper alignment to decrease pain    Time 2   Period Weeks           PT Long Term Goals - 12/06/14 1616    PT LONG TERM GOAL #1   Title Pt to be I in advance HEP to improve pelvic stabilization so her SI jt does not rotate to prevent future pain    Time 4   Period Weeks   PT LONG TERM GOAL #2   Title Pt to be able to sit with comfort for 2 hours to see a movie   Time 6   Period Weeks   PT LONG TERM GOAL #3   Title Pt to be able to stand for an hour to make an in depth meal  and to have decreased pain at work to improve her quality of life.    Time 6   PT LONG TERM GOAL #4   Title Pt to state  that she is completing alll ADL's with no pain and is I in an exercise program to improve her quality of life.    Time 8   Period Weeks               Plan - 12/06/14 1606    Clinical Impression Statement [name] is a 44 yo female who has had low back pain on the left side for years.  A MRI was completed that showed Lt sacrilitis.  She had an injection in the Lt side on November 17th which improved her pain significantly.  She is now being referred to skilled physcial therapy for pelvic stabilization and strengthing exercises.  Examination demonstrates that her Lt SI jt is posteriorly rotated.  Using mm energy techniques she was easily  aligned.  Exam also demonstrate weakned gluteal maximus mm Bilaterally.  Ms. Rosana Berger witll benefit from skilled PT to address these issues and maximize her functioning ability to  improve her quality of life.     Pt will benefit from skilled therapeutic intervention in order to improve on the following deficits Decreased activity tolerance;Pain;Difficulty walking;Decreased strength   Rehab Potential Good   PT Frequency --  every other week x 4 weeks.    PT Treatment/Interventions ADLs/Self Care Home Management;Therapeutic exercise;Patient/family education   PT Next Visit Plan Due to pt just going to a new insurance and having a high deductable requirement Ms. Rosana Berger would prefer to complete most of her therapy at home.  Next visit check SI and gluteal strength.  Add all 4 opposite arm leg raise.  Lt  Hip abduction and extension in modified long sitting postition.    PT Home Exercise Plan given for SI mobilization , bridges and prone hip extension.    Consulted and Agree with Plan of Care Patient         Problem List Patient Active Problem List   Diagnosis Date Noted  . Right shoulder pain 04/22/2011  . Bursitis of right shoulder 04/22/2011    Rayetta Humphrey, PT CLT [phone] 12/06/2014, 4:22 PM  [facility] [address] [place], [place], [address] Phone: [phone]   Fax:  [phone]  Name: TYRIELLE VALLAS MRN: [id number] Date of Birth: [birth year]

## 2014-12-20 ENCOUNTER — Encounter (HOSPITAL_COMMUNITY): Payer: 59

## 2015-01-03 ENCOUNTER — Ambulatory Visit (HOSPITAL_COMMUNITY): Payer: Managed Care, Other (non HMO)

## 2015-01-03 ENCOUNTER — Telehealth (HOSPITAL_COMMUNITY): Payer: Self-pay

## 2015-01-03 NOTE — Telephone Encounter (Signed)
No show, tried to call about missed apt. both home numbers that are wrong numbers, called mobile and mail box full.  [address] [place]; [place] [phone]

## 2015-01-15 ENCOUNTER — Ambulatory Visit (HOSPITAL_COMMUNITY): Payer: Managed Care, Other (non HMO) | Attending: Physical Medicine and Rehabilitation | Admitting: Physical Therapy

## 2015-01-15 ENCOUNTER — Telehealth (HOSPITAL_COMMUNITY): Payer: Self-pay | Admitting: Physical Therapy

## 2015-01-15 NOTE — Telephone Encounter (Signed)
Patient had her second no-show today. Called and explained policy, reminded patient of time/date of next session.  Deniece Ree PT, DPT [phone]

## 2015-01-30 ENCOUNTER — Ambulatory Visit (HOSPITAL_COMMUNITY): Payer: Managed Care, Other (non HMO) | Admitting: Physical Therapy

## 2015-01-30 ENCOUNTER — Telehealth (HOSPITAL_COMMUNITY): Payer: Self-pay | Admitting: Physical Therapy

## 2015-01-30 NOTE — Telephone Encounter (Signed)
Patient had her third no-show today. Attempted to call home number today; family member answered and stated that PT could not leave message for patient. Attempted to call patient's cell phone but mailbox full. Will follow up by sending letter to patient's home detailing that she is being discharged at this time due to 3 consecutive no-shows.  Deniece Ree PT, DPT [phone]

## 2015-01-30 NOTE — Therapy (Signed)
[facility] [place], [place], [address] Phone: [phone]   Fax:  [phone]  Patient Details  Name: Kimberly Fitzpatrick MRN: [id number] Date of Birth: 16-Oct-1970 Referring Provider:  Laroy Apple, MD  Encounter Date: 01/30/2015   PHYSICAL THERAPY DISCHARGE SUMMARY  Visits from Start of Care: 1  Current functional level related to goals / functional outcomes: Patient has not returned since last visit despite contact from PT team regarding time/date of upcoming sessions. She has reached 3 consecutive no-shows and is being discharged per clinic policy.    Remaining deficits: Unable to assess    Education / Equipment: Unable to assess  Plan: Patient agrees to discharge.  Patient goals were not met. Patient is being discharged due to not returning since the last visit.  ?????       Deniece Ree PT, DPT [facility] [address] [place], [place], [address] Phone: [phone]   Fax:  [phone]

## 2015-03-26 ENCOUNTER — Other Ambulatory Visit (HOSPITAL_COMMUNITY): Payer: Self-pay | Admitting: Pulmonary Disease

## 2015-03-26 DIAGNOSIS — R1032 Left lower quadrant pain: Secondary | ICD-10-CM

## 2015-04-01 ENCOUNTER — Encounter (INDEPENDENT_AMBULATORY_CARE_PROVIDER_SITE_OTHER): Payer: Self-pay | Admitting: *Deleted

## 2015-04-01 ENCOUNTER — Ambulatory Visit (HOSPITAL_COMMUNITY)
Admission: RE | Admit: 2015-04-01 | Discharge: 2015-04-01 | Disposition: A | Discharge status: Home / Self Care | Payer: Managed Care, Other (non HMO) | Source: Ambulatory Visit | Attending: Pulmonary Disease | Admitting: Pulmonary Disease

## 2015-04-01 ENCOUNTER — Ambulatory Visit (HOSPITAL_COMMUNITY): Payer: Managed Care, Other (non HMO)

## 2015-04-01 DIAGNOSIS — R1032 Left lower quadrant pain: Secondary | ICD-10-CM | POA: Diagnosis present

## 2015-04-01 DIAGNOSIS — M461 Sacroiliitis, not elsewhere classified: Secondary | ICD-10-CM | POA: Insufficient documentation

## 2015-04-01 DIAGNOSIS — N83202 Unspecified ovarian cyst, left side: Secondary | ICD-10-CM | POA: Insufficient documentation

## 2015-04-01 MED ORDER — IOHEXOL 300 MG/ML  SOLN
100.0000 mL | Freq: Once | INTRAMUSCULAR | Status: AC | PRN
  Administered 2015-04-01: 100 mL via INTRAVENOUS

## 2015-04-10 ENCOUNTER — Encounter (HOSPITAL_COMMUNITY): Payer: Self-pay

## 2015-04-14 ENCOUNTER — Ambulatory Visit (INDEPENDENT_AMBULATORY_CARE_PROVIDER_SITE_OTHER): Payer: Self-pay | Admitting: Internal Medicine

## 2015-04-18 ENCOUNTER — Encounter (HOSPITAL_COMMUNITY): Payer: Self-pay

## 2015-04-23 ENCOUNTER — Encounter (INDEPENDENT_AMBULATORY_CARE_PROVIDER_SITE_OTHER): Payer: Self-pay | Admitting: *Deleted

## 2015-04-23 ENCOUNTER — Encounter (INDEPENDENT_AMBULATORY_CARE_PROVIDER_SITE_OTHER): Payer: Self-pay | Admitting: Internal Medicine

## 2015-04-23 ENCOUNTER — Other Ambulatory Visit (INDEPENDENT_AMBULATORY_CARE_PROVIDER_SITE_OTHER): Payer: Self-pay | Admitting: Internal Medicine

## 2015-04-23 ENCOUNTER — Ambulatory Visit (INDEPENDENT_AMBULATORY_CARE_PROVIDER_SITE_OTHER): Payer: Managed Care, Other (non HMO) | Admitting: Internal Medicine

## 2015-04-23 VITALS — BP 142/48 | HR 72 | Temp 98.3°F | Ht 64.0 in | Wt 180.8 lb

## 2015-04-23 DIAGNOSIS — K625 Hemorrhage of anus and rectum: Secondary | ICD-10-CM | POA: Diagnosis not present

## 2015-04-23 NOTE — Progress Notes (Addendum)
Subjective:    Patient ID: Kimberly Fitzpatrick, female    DOB: 12/11/1970, 45 y.o.   MRN: [id number] Patient states she is not hard to sedate and she does not take the Hydrocodone everyday.  Rarely takes Xanax  HPI Referred by Dr .Luan Pulling for rectal bleeding. Hx of rectal bleeding x 9 years. She has seen blood in the toilet. She has had a colonoscopy years ago for same 8-9 yrs ago. She reports it was normal. She says she has had pain in her left upper quadrant at times. Sometimes she will feel a "knot' in her abdomen. Her appetite is good. No weight loss. No dysphagia. She has acid reflux and is controlled with Omeprazole. She has a BM x 3 a day. Stools are nice and formed. No diarrhea.  No melena . She had rectal bleeding x 5 days in a a row several weeks ago. She has not seen any since.  No family hx of colon cancer. She takes Meloxicam daily.  11/13/2007 Colonoscopy: Dr. Anthony Sar: rectal bleeding. No active bleeding noted. Internal hemorrhoids. Normal colonoscopy otherwise.   03/27/2015 H and H  15/6 and 45.7. MCV 90.9  04/01/2015 CT abdomen/pelvis with CM.      CLINICAL DATA: LEFT lower quadrant pain off and on for 9 years, bright red blood in stool off and on for 9-10 years, has a knot in her LEFT lower quadrant when she has the pain.  EXAM: CT ABDOMEN AND PELVIS WITH CONTRAST  IMPRESSION: LEFT ovarian cyst 3.5 x 3.5 x 3.9 cm.  No other intra-abdominal or intrapelvic abnormalities.  Chronic asymmetric LEFT sacroiliitis.   Review of Systems Past Medical History  Diagnosis Date  . Chest pain   . Chronic pain in shoulder   . GERD (gastroesophageal reflux disease)   . Low back pain   . History of gastrointestinal bleeding   . Varicose veins  painful varicosities left leg  . Hypertension     Past Surgical History  Procedure Laterality Date  . Knee cartilage surgery  right knee 1987   right knee 1999  . Anterior cruciate ligament repair  right knee  1999  . Tubal  ligation  1997  . Dilation and curettage of uterus    . Uterine ablation      No Known Allergies  Current Outpatient Prescriptions on File Prior to Visit  Medication Sig Dispense Refill  . gabapentin (NEURONTIN) 300 MG capsule Take 300 mg by mouth 3 (three) times daily.    Marland Kitchen HYDROcodone-acetaminophen (NORCO) 5-325 MG per tablet Take 1 tablet by mouth every 6 (six) hours as needed. For pain    . olmesartan (BENICAR) 40 MG tablet Take 40 mg by mouth daily.    Marland Kitchen omeprazole (PRILOSEC) 20 MG capsule Take 20 mg by mouth daily.       No current facility-administered medications on file prior to visit.        Objective:   Physical Exam Blood pressure 142/48, pulse 72, temperature 98.3 F (36.8 C), height 5\' 4"  (1.626 m), weight 180 lb 12.8 oz (82.01 kg). Alert and oriented. Skin warm and dry. Oral mucosa is moist.   . Sclera anicteric, conjunctivae is pink. Thyroid not enlarged. No cervical lymphadenopathy. Lungs clear. Heart regular rate and rhythm.  Abdomen is soft. Bowel sounds are positive. No hepatomegaly. No abdominal masses felt. No tenderness.  No edema to lower extremities.  Rectal exam: no stool and guaiac negative.   Lot [id number] Ex 9/17  Assessment & Plan:  Intermittent rectal bleeding. ? Hemorrhoidal. Colonic neoplasm in the differential.  Colonoscopy. The risks and benefits such as perforation, bleeding, and infection were reviewed with the patient and is agreeable.

## 2015-04-23 NOTE — Telephone Encounter (Signed)
This encounter was created in error - please disregard.

## 2015-04-23 NOTE — Patient Instructions (Signed)
Colonoscopy.  The risks and benefits such as perforation, bleeding, and infection were reviewed with the patient and is agreeable. 

## 2015-04-24 ENCOUNTER — Encounter (INDEPENDENT_AMBULATORY_CARE_PROVIDER_SITE_OTHER): Payer: Self-pay

## 2015-05-06 ENCOUNTER — Ambulatory Visit (INDEPENDENT_AMBULATORY_CARE_PROVIDER_SITE_OTHER): Payer: Managed Care, Other (non HMO) | Admitting: Obstetrics & Gynecology

## 2015-05-06 ENCOUNTER — Encounter: Payer: Self-pay | Admitting: Obstetrics & Gynecology

## 2015-05-06 ENCOUNTER — Other Ambulatory Visit (HOSPITAL_COMMUNITY)
Admission: RE | Admit: 2015-05-06 | Discharge: 2015-05-06 | Disposition: A | Discharge status: Home / Self Care | Payer: Managed Care, Other (non HMO) | Source: Ambulatory Visit | Attending: Obstetrics & Gynecology | Admitting: Obstetrics & Gynecology

## 2015-05-06 VITALS — BP 140/90 | HR 80 | Ht 63.0 in | Wt 177.3 lb

## 2015-05-06 DIAGNOSIS — M791 Myalgia: Secondary | ICD-10-CM

## 2015-05-06 DIAGNOSIS — Z01419 Encounter for gynecological examination (general) (routine) without abnormal findings: Secondary | ICD-10-CM | POA: Insufficient documentation

## 2015-05-06 DIAGNOSIS — M7918 Myalgia, other site: Secondary | ICD-10-CM

## 2015-05-06 DIAGNOSIS — Z1151 Encounter for screening for human papillomavirus (HPV): Secondary | ICD-10-CM | POA: Diagnosis not present

## 2015-05-06 NOTE — Progress Notes (Signed)
Patient ID: Kimberly Fitzpatrick, female   DOB: 08-Oct-1970, 45 y.o.   MRN: [id number] Subjective:     Kimberly Fitzpatrick is a 45 y.o. female here for a routine exam.  No LMP recorded. Patient has had an ablation. No obstetric history on file. Birth Control Method:  BTL and ablation Menstrual Calendar(currently): amenorrheic  Current complaints: pain in LUQ.   Current acute medical issues:  none   Recent Gynecologic History No LMP recorded. Patient has had an ablation. Last Pap: 2013,  normal Last mammogram: 05/20/2014,  normal  Past Medical History  Diagnosis Date  . Chest pain   . Chronic pain in shoulder   . GERD (gastroesophageal reflux disease)   . Low back pain   . History of gastrointestinal bleeding   . Varicose veins  painful varicosities left leg  . Hypertension     Past Surgical History  Procedure Laterality Date  . Knee cartilage surgery  right knee 1987   right knee 1999  . Anterior cruciate ligament repair  right knee  1999  . Tubal ligation  1997  . Dilation and curettage of uterus    . Uterine ablation      OB History    No data available      Social History   Social History  . Marital Status: Single    Spouse Name: N/A  . Number of Children: N/A  . Years of Education: N/A   Social History Main Topics  . Smoking status: Current Every Day Smoker -- 0.50 packs/day  . Smokeless tobacco: None     Comment: 1/2 to 1 pack a day x 26 yrs.   . Alcohol Use: No  . Drug Use: No  . Sexual Activity: Not Asked   Other Topics Concern  . None   Social History Narrative    Family History  Problem Relation Age of Onset  . Cancer Maternal Grandmother      Current outpatient prescriptions:  .  ALPRAZolam (XANAX) 0.5 MG tablet, Take 0.5 mg by mouth at bedtime as needed for anxiety., Disp: , Rfl:  .  gabapentin (NEURONTIN) 300 MG capsule, Take 300 mg by mouth 3 (three) times daily., Disp: , Rfl:  .  HYDROcodone-acetaminophen (NORCO) 5-325 MG per tablet, Take 1  tablet by mouth every 6 (six) hours as needed. For pain, Disp: , Rfl:  .  meloxicam (MOBIC) 15 MG tablet, Take 15 mg by mouth daily., Disp: , Rfl:  .  olmesartan (BENICAR) 40 MG tablet, Take 40 mg by mouth daily., Disp: , Rfl:  .  omeprazole (PRILOSEC) 20 MG capsule, Take 20 mg by mouth daily.  , Disp: , Rfl:  .  PARoxetine (PAXIL) 20 MG tablet, Take 20 mg by mouth daily. Reported on 05/06/2015, Disp: , Rfl:   Review of Systems  Review of Systems  Constitutional: Negative for fever, chills, weight loss, malaise/fatigue and diaphoresis.  HENT: Negative for hearing loss, ear pain, nosebleeds, congestion, sore throat, neck pain, tinnitus and ear discharge.   Eyes: Negative for blurred vision, double vision, photophobia, pain, discharge and redness.  Respiratory: Negative for cough, hemoptysis, sputum production, shortness of breath, wheezing and stridor.   Cardiovascular: Negative for chest pain, palpitations, orthopnea, claudication, leg swelling and PND.  Gastrointestinal: negative for abdominal pain. Negative for heartburn, nausea, vomiting, diarrhea, constipation, blood in stool and melena.  Genitourinary: Negative for dysuria, urgency, frequency, hematuria and flank pain.  Musculoskeletal: Negative for myalgias, back pain, joint pain and falls.  Skin: Negative for itching and rash.  Neurological: Negative for dizziness, tingling, tremors, sensory change, speech change, focal weakness, seizures, loss of consciousness, weakness and headaches.  Endo/Heme/Allergies: Negative for environmental allergies and polydipsia. Does not bruise/bleed easily.  Psychiatric/Behavioral: Negative for depression, suicidal ideas, hallucinations, memory loss and substance abuse. The patient is not nervous/anxious and does not have insomnia.        Objective:  Blood pressure 140/90, pulse 80, height 5\' 3"  (1.6 m), weight 177 lb 4.8 oz (80.423 kg).   Physical Exam  Vitals reviewed. Constitutional: She is oriented  to person, place, and time. She appears well-developed and well-nourished.  HENT:  Head: Normocephalic and atraumatic.        Right Ear: External ear normal.  Left Ear: External ear normal.  Nose: Nose normal.  Mouth/Throat: Oropharynx is clear and moist.  Eyes: Conjunctivae and EOM are normal. Pupils are equal, round, and reactive to light. Right eye exhibits no discharge. Left eye exhibits no discharge. No scleral icterus.  Neck: Normal range of motion. Neck supple. No tracheal deviation present. No thyromegaly present.  Cardiovascular: Normal rate, regular rhythm, normal heart sounds and intact distal pulses.  Exam reveals no gallop and no friction rub.   No murmur heard. Respiratory: Effort normal and breath sounds normal. No respiratory distress. She has no wheezes. She has no rales. She exhibits no tenderness.  GI: Soft. Bowel sounds are normal. She exhibits no distension and no mass. There is no tenderness. There is no rebound and no guarding.  Genitourinary:  Breasts no masses skin changes or nipple changes bilaterally      Vulva is normal without lesions Vagina is pink moist without discharge Cervix normal in appearance and pap is done Uterus is normal size shape and contour Adnexa is negative with normal sized ovaries   Musculoskeletal: Normal range of motion. She exhibits no edema and no tenderness.  Neurological: She is alert and oriented to person, place, and time. She has normal reflexes. She displays normal reflexes. No cranial nerve deficit. She exhibits normal muscle tone. Coordination normal.  Skin: Skin is warm and dry. No rash noted. No erythema. No pallor.  Psychiatric: She has a normal mood and affect. Her behavior is normal. Judgment and thought content normal.       Medications Ordered at today's visit: No orders of the defined types were placed in this encounter.    Other orders placed at today's visit: No orders of the defined types were placed in this  encounter.      Assessment:    Healthy female exam.    Plan:    Mammogram ordered. Follow up in: 5 months.    To have gyn sonogram and see dr Elonda Husky  Trigger Point Injection   Pre-operative diagnosis: myofascial pain  Post-operative diagnosis: myofascial pain  After risks and benefits were explained including bleeding, infection, worsening of the pain, damage to the area being injected, weakness, allergic reaction to medications, vascular injection, and nerve damage, signed consent was obtained.  All questions were answered.    The area of the trigger point was identified and the skin prepped three times with alcohol and the alcohol allowed to dry.  Next, a 25 gauge 0.5 inch needle was placed in the area of the trigger point.  Once reproduction of the pain was elicited and negative aspiration confirmed, the trigger point was injected and the needle removed.    The patient did tolerate the procedure well and there were not  complications.    Medication used: 10 cc 0.5% marcine LUQ  Trigger points injected: 1    Trigger point(s) location(s):  left

## 2015-05-08 LAB — CYTOLOGY - PAP

## 2015-05-23 ENCOUNTER — Encounter (HOSPITAL_COMMUNITY): Payer: Self-pay | Admitting: *Deleted

## 2015-05-23 ENCOUNTER — Ambulatory Visit (HOSPITAL_COMMUNITY)
Admission: RE | Admit: 2015-05-23 | Discharge: 2015-05-23 | Disposition: A | Discharge status: Home / Self Care | Payer: Managed Care, Other (non HMO) | Source: Ambulatory Visit | Attending: Internal Medicine | Admitting: Internal Medicine

## 2015-05-23 ENCOUNTER — Encounter (HOSPITAL_COMMUNITY)
Admission: RE | Disposition: A | Discharge status: Home / Self Care | Payer: Self-pay | Source: Ambulatory Visit | Attending: Internal Medicine

## 2015-05-23 DIAGNOSIS — D123 Benign neoplasm of transverse colon: Secondary | ICD-10-CM | POA: Insufficient documentation

## 2015-05-23 DIAGNOSIS — K5730 Diverticulosis of large intestine without perforation or abscess without bleeding: Secondary | ICD-10-CM | POA: Diagnosis not present

## 2015-05-23 DIAGNOSIS — K648 Other hemorrhoids: Secondary | ICD-10-CM | POA: Insufficient documentation

## 2015-05-23 DIAGNOSIS — K625 Hemorrhage of anus and rectum: Secondary | ICD-10-CM

## 2015-05-23 DIAGNOSIS — I10 Essential (primary) hypertension: Secondary | ICD-10-CM | POA: Diagnosis not present

## 2015-05-23 DIAGNOSIS — K219 Gastro-esophageal reflux disease without esophagitis: Secondary | ICD-10-CM | POA: Diagnosis not present

## 2015-05-23 DIAGNOSIS — K921 Melena: Secondary | ICD-10-CM | POA: Insufficient documentation

## 2015-05-23 DIAGNOSIS — Z79899 Other long term (current) drug therapy: Secondary | ICD-10-CM | POA: Insufficient documentation

## 2015-05-23 DIAGNOSIS — F17200 Nicotine dependence, unspecified, uncomplicated: Secondary | ICD-10-CM | POA: Diagnosis not present

## 2015-05-23 HISTORY — PX: COLONOSCOPY: SHX5424

## 2015-05-23 SURGERY — COLONOSCOPY
Anesthesia: Moderate Sedation

## 2015-05-23 MED ORDER — MEPERIDINE HCL 50 MG/ML IJ SOLN
INTRAMUSCULAR | Status: AC
  Filled 2015-05-23: qty 1

## 2015-05-23 MED ORDER — MIDAZOLAM HCL 5 MG/5ML IJ SOLN
INTRAMUSCULAR | Status: AC
  Filled 2015-05-23: qty 10

## 2015-05-23 MED ORDER — MIDAZOLAM HCL 5 MG/5ML IJ SOLN
INTRAMUSCULAR | Status: DC | PRN
Start: 2015-05-23 — End: 2015-05-23
  Administered 2015-05-23: 1 mg via INTRAVENOUS
  Administered 2015-05-23 (×2): 2 mg via INTRAVENOUS
  Administered 2015-05-23: 3 mg via INTRAVENOUS
  Administered 2015-05-23: 2 mg via INTRAVENOUS

## 2015-05-23 MED ORDER — MEPERIDINE HCL 50 MG/ML IJ SOLN
INTRAMUSCULAR | Status: DC | PRN
  Administered 2015-05-23 (×2): 25 mg via INTRAVENOUS

## 2015-05-23 MED ORDER — SODIUM CHLORIDE 0.9 % IV SOLN
INTRAVENOUS | Status: DC
  Administered 2015-05-23: 1000 mL via INTRAVENOUS

## 2015-05-23 MED ORDER — STERILE WATER FOR IRRIGATION IR SOLN
Status: DC | PRN
  Administered 2015-05-23: 12:00:00

## 2015-05-23 NOTE — H&P (Signed)
Kimberly Fitzpatrick is an 45 y.o. female.   Chief Complaint: Patient is here for colonoscopy. HPI: Patient is 45 year old Caucasian female who presents with chronic hematochezia. Last colonoscopy was 8 years ago and she was felt to have hemorrhoids. She says she quickly daily for 5 days and then may not need for several days. She has 3 formed stools daily. She denies urgency or constipation. She has been having intermittent left-sided abdominal pain. She was seen by Dr. Elonda Husky and pain felt to be musculoskeletal and she was given Marcaine injection which helped. Family history is negative for CRC.  Past Medical History  Diagnosis Date  . Chest pain   . Chronic pain in shoulder   . GERD (gastroesophageal reflux disease)   . Low back pain   . History of gastrointestinal bleeding   . Varicose veins  painful varicosities left leg  . Hypertension     Past Surgical History  Procedure Laterality Date  . Knee cartilage surgery  right knee 1987   right knee 1999  . Anterior cruciate ligament repair  right knee  1999  . Tubal ligation  1997  . Dilation and curettage of uterus    . Uterine ablation      Family History  Problem Relation Age of Onset  . Cancer Maternal Grandmother    Social History:  reports that she has been smoking.  She does not have any smokeless tobacco history on file. She reports that she drinks alcohol. She reports that she does not use illicit drugs.  Allergies: No Known Allergies  Medications Prior to Admission  Medication Sig Dispense Refill  . ALPRAZolam (XANAX) 0.5 MG tablet Take 0.5 mg by mouth at bedtime as needed for anxiety.    Marland Kitchen CINNAMON PO Take 1 capsule by mouth daily.    Marland Kitchen gabapentin (NEURONTIN) 300 MG capsule Take 300 mg by mouth 3 (three) times daily.    Marland Kitchen HYDROcodone-acetaminophen (NORCO) 7.5-325 MG tablet Take 1 tablet by mouth every 6 (six) hours as needed for moderate pain.     . meloxicam (MOBIC) 15 MG tablet Take 15 mg by mouth daily.    . naproxen  sodium (ANAPROX) 220 MG tablet Take 220 mg by mouth daily as needed (pain).    Marland Kitchen olmesartan (BENICAR) 40 MG tablet Take 40 mg by mouth daily.    Marland Kitchen omeprazole (PRILOSEC) 20 MG capsule Take 20 mg by mouth daily.      Marland Kitchen PARoxetine (PAXIL) 20 MG tablet Take 20 mg by mouth daily. Reported on 05/06/2015      No results found for this or any previous visit (from the past 48 hour(s)). No results found.  ROS  Blood pressure 144/84, pulse 77, temperature 97.7 F (36.5 C), temperature source Oral, resp. rate 13, height 5\' 3"  (1.6 m), weight 176 lb (79.833 kg), SpO2 97 %. Physical Exam  Constitutional: She appears well-developed and well-nourished.  HENT:  Mouth/Throat: Oropharynx is clear and moist.  Eyes: Conjunctivae are normal. No scleral icterus.  Neck: No thyromegaly present.  Cardiovascular: Normal rate, regular rhythm and normal heart sounds.   No murmur heard. Respiratory: Effort normal and breath sounds normal.  GI: Soft. She exhibits no distension and no mass. There is no tenderness.  Musculoskeletal: She exhibits no edema.  Lymphadenopathy:    She has no cervical adenopathy.  Neurological: She is alert.  Skin: Skin is warm and dry.  She has a tattoo at anterior chest.     Assessment/Plan Chronic hematochezia.  Diagnostic colonoscopy.  Rogene Houston, MD 05/23/2015, 12:21 PM

## 2015-05-23 NOTE — Op Note (Signed)
[facility] Patient Name: Kimberly Fitzpatrick Procedure Date: 05/23/2015 12:16 PM MRN: [id number] Date of Birth: 1970/11/12 Attending MD: Hildred Laser , MD CSN: [id number] Age: 45 Admit Type: Outpatient Procedure:                Colonoscopy Indications:              Hematochezia Providers:                Hildred Laser, MD, Gwenlyn Fudge, RN, Isabella Stalling, Technician Referring MD:             Jasper Loser. Luan Pulling, MD Medicines:                Meperidine 50 mg IV, Midazolam 10 mg IV Complications:            No immediate complications. Estimated Blood Loss:     Estimated blood loss was minimal. Procedure:                Pre-Anesthesia Assessment:                           - Prior to the procedure, a History and Physical                            was performed, and patient medications and                            allergies were reviewed. The patient's tolerance of                            previous anesthesia was also reviewed. The risks                            and benefits of the procedure and the sedation                            options and risks were discussed with the patient.                            All questions were answered, and informed consent                            was obtained. Prior Anticoagulants: The patient                            last took previous NSAID medication 3 days prior to                            the procedure. ASA Grade Assessment: II - A patient                            with mild systemic disease. After reviewing the  risks and benefits, the patient was deemed in                            satisfactory condition to undergo the procedure.                           After obtaining informed consent, the colonoscope                            was passed under direct vision. Throughout the                            procedure, the patient's blood pressure, pulse, and      oxygen saturations were monitored continuously. The                            [id number]) scope was introduced through                            the anus and advanced to the the cecum, identified                            by appendiceal orifice and ileocecal valve. The                            colonoscopy was performed without difficulty. The                            patient tolerated the procedure well. The quality                            of the bowel preparation was adequate. The                            ileocecal valve, appendiceal orifice, and rectum                            were photographed. Scope In: 12:33:52 PM Scope Out: 12:57:26 PM Scope Withdrawal Time: 0 hours 15 minutes 31 seconds  Total Procedure Duration: 0 hours 23 minutes 34 seconds  Findings:      A 4 mm polyp was found in the splenic flexure. The polyp was sessile.       The polyp was removed with a cold snare. Resection and retrieval were       complete.      A few small-mouthed diverticula were found in the sigmoid colon.      Internal hemorrhoids were found during retroflexion. The hemorrhoids       were medium-sized. Impression:               - One 4 mm polyp at the splenic flexure, removed                            with a cold snare. Resected and retrieved.                           -  Diverticulosis in the sigmoid colon.                           - Internal hemorrhoids. Moderate Sedation:      Moderate (conscious) sedation was administered by the endoscopy nurse       and supervised by the endoscopist. The following parameters were       monitored: oxygen saturation, heart rate, blood pressure, CO2       capnography and response to care. Total physician intraservice time was       31 minutes. Recommendation:           - Patient has a contact number available for                            emergencies. The signs and symptoms of potential                            delayed complications  were discussed with the                            patient. Return to normal activities tomorrow.                            Written discharge instructions were provided to the                            patient.                           - Patient has a contact number available for                            emergencies. The signs and symptoms of potential                            delayed complications were discussed with the                            patient. Return to normal activities tomorrow.                            Written discharge instructions were provided to the                            patient.                           - High fiber diet today.                           - Continue present medications.                           - Repeat colonoscopy for surveillance based on  pathology results. Procedure Code(s):        --- Professional ---                           [phone], Colonoscopy, flexible; with removal of                            tumor(s), polyp(s), or other lesion(s) by snare                            technique                           99152, Moderate sedation services provided by the                            same physician or other qualified health care                            professional performing the diagnostic or                            therapeutic service that the sedation supports,                            requiring the presence of an independent trained                            observer to assist in the monitoring of the                            patient's level of consciousness and physiological                            status; initial 15 minutes of intraservice time,                            patient age 80 years or older                           [phone], Moderate sedation services; each additional                            15 minutes intraservice time Diagnosis Code(s):        --- Professional ---                            D12.3, Benign neoplasm of transverse colon (hepatic                            flexure or splenic flexure)                           K64.8, Other hemorrhoids  K92.1, Melena (includes Hematochezia)                           K57.30, Diverticulosis of large intestine without                            perforation or abscess without bleeding CPT copyright 2016 American Medical Association. All rights reserved. The codes documented in this report are preliminary and upon coder review may  be revised to meet current compliance requirements. Hildred Laser, MD Hildred Laser, MD 05/23/2015 1:06:25 PM This report has been signed electronically. Number of Addenda: 0

## 2015-05-23 NOTE — Discharge Instructions (Signed)
Please do not take naproxen while you're on meloxicam(naproxen removed from list a few medications). Resume other medications as before. High-fiber diet. No driving for 24 hours. Physician will call with biopsy results.   High-Fiber Diet Fiber, also called dietary fiber, is a type of carbohydrate found in fruits, vegetables, whole grains, and beans. A high-fiber diet can have many health benefits. Your health care provider may recommend a high-fiber diet to help:  Prevent constipation. Fiber can make your bowel movements more regular.  Lower your cholesterol.  Relieve hemorrhoids, uncomplicated diverticulosis, or irritable bowel syndrome.  Prevent overeating as part of a weight-loss plan.  Prevent heart disease, type 2 diabetes, and certain cancers. WHAT IS MY PLAN? The recommended daily intake of fiber includes:  38 grams for men under age 72.  68 grams for men over age 57.  8 grams for women under age 36.  16 grams for women over age 66. You can get the recommended daily intake of dietary fiber by eating a variety of fruits, vegetables, grains, and beans. Your health care provider may also recommend a fiber supplement if it is not possible to get enough fiber through your diet. WHAT DO I NEED TO KNOW ABOUT A HIGH-FIBER DIET?  Fiber supplements have not been widely studied for their effectiveness, so it is better to get fiber through food sources.  Always check the fiber content on thenutrition facts label of any prepackaged food. Look for foods that contain at least 5 grams of fiber per serving.  Ask your dietitian if you have questions about specific foods that are related to your condition, especially if those foods are not listed in the following section.  Increase your daily fiber consumption gradually. Increasing your intake of dietary fiber too quickly may cause bloating, cramping, or gas.  Drink plenty of water. Water helps you to digest fiber. WHAT FOODS CAN I  EAT? Grains Whole-grain breads. Multigrain cereal. Oats and oatmeal. Brown rice. Barley. Bulgur wheat. [place]. Bran muffins. Popcorn. Rye wafer crackers. Vegetables Sweet potatoes. Spinach. Kale. Artichokes. Cabbage. Broccoli. Green peas. Carrots. Squash. Fruits Berries. Pears. Apples. Oranges. Avocados. Prunes and raisins. Dried figs. Meats and Other Protein Sources [place], kidney, pinto, and soy beans. Split peas. Lentils. Nuts and seeds. Dairy Fiber-fortified yogurt. Beverages Fiber-fortified soy milk. Fiber-fortified orange juice. Other Fiber bars. The items listed above may not be a complete list of recommended foods or beverages. Contact your dietitian for more options. WHAT FOODS ARE NOT RECOMMENDED? Grains White bread. Pasta made with refined flour. White rice. Vegetables Fried potatoes. Canned vegetables. Well-cooked vegetables.  Fruits Fruit juice. Cooked, strained fruit. Meats and Other Protein Sources Fatty cuts of meat. Fried Sales executive or fried fish. Dairy Milk. Yogurt. Cream cheese. Sour cream. Beverages Soft drinks. Other Cakes and pastries. Butter and oils. The items listed above may not be a complete list of foods and beverages to avoid. Contact your dietitian for more information. WHAT ARE SOME TIPS FOR INCLUDING HIGH-FIBER FOODS IN MY DIET?  Eat a wide variety of high-fiber foods.  Make sure that half of all grains consumed each day are whole grains.  Replace breads and cereals made from refined flour or white flour with whole-grain breads and cereals.  Replace white rice with brown rice, bulgur wheat, or millet.  Start the day with a breakfast that is high in fiber, such as a cereal that contains at least 5 grams of fiber per serving.  Use beans in place of meat in soups, salads, or  pasta.  Eat high-fiber snacks, such as berries, raw vegetables, nuts, or popcorn.   This information is not intended to replace advice given to you by your health care  provider. Make sure you discuss any questions you have with your health care provider.   Document Released: 12/21/2004 Document Revised: 01/11/2014 Document Reviewed: 06/05/2013 Elsevier Interactive Patient Education 2016 [employer] Colonoscopy, Care After Refer to this sheet in the next few weeks. These instructions provide you with information on caring for yourself after your procedure. Your health care provider may also give you more specific instructions. Your treatment has been planned according to current medical practices, but problems sometimes occur. Call your health care provider if you have any problems or questions after your procedure. WHAT TO EXPECT AFTER THE PROCEDURE  After your procedure, it is typical to have the following:  A small amount of blood in your stool.  Moderate amounts of gas and mild abdominal cramping or bloating. HOME CARE INSTRUCTIONS  Do not drive, operate machinery, or sign important documents for 24 hours.  You may shower and resume your regular physical activities, but move at a slower pace for the first 24 hours.  Take frequent rest periods for the first 24 hours.  Walk around or put a warm pack on your abdomen to help reduce abdominal cramping and bloating.  Drink enough fluids to keep your urine clear or pale yellow.  You may resume your normal diet as instructed by your health care provider. Avoid heavy or fried foods that are hard to digest.  Avoid drinking alcohol for 24 hours or as instructed by your health care provider.  Only take over-the-counter or prescription medicines as directed by your health care provider.  If a tissue sample (biopsy) was taken during your procedure:  Do not take aspirin or blood thinners for 7 days, or as instructed by your health care provider.  Do not drink alcohol for 7 days, or as instructed by your health care provider.  Eat soft foods for the first 24 hours. SEEK MEDICAL CARE IF: You have  persistent spotting of blood in your stool 2-3 days after the procedure. SEEK IMMEDIATE MEDICAL CARE IF:  You have more than a small spotting of blood in your stool.  You pass large blood clots in your stool.  Your abdomen is swollen (distended).  You have nausea or vomiting.  You have a fever.  You have increasing abdominal pain that is not relieved with medicine.   This information is not intended to replace advice given to you by your health care provider. Make sure you discuss any questions you have with your health care provider.   Document Released: 08/05/2003 Document Revised: 10/11/2012 Document Reviewed: 08/28/2012 Elsevier Interactive Patient Education [employer].

## 2015-05-27 ENCOUNTER — Encounter (HOSPITAL_COMMUNITY): Payer: Self-pay | Admitting: Internal Medicine

## 2015-07-15 ENCOUNTER — Other Ambulatory Visit (HOSPITAL_COMMUNITY): Payer: Self-pay | Admitting: Pulmonary Disease

## 2015-07-15 DIAGNOSIS — Z1231 Encounter for screening mammogram for malignant neoplasm of breast: Secondary | ICD-10-CM

## 2015-07-21 ENCOUNTER — Ambulatory Visit (HOSPITAL_COMMUNITY)
Admission: RE | Admit: 2015-07-21 | Discharge: 2015-07-21 | Disposition: A | Discharge status: Home / Self Care | Payer: Managed Care, Other (non HMO) | Source: Ambulatory Visit | Attending: Pulmonary Disease | Admitting: Pulmonary Disease

## 2015-07-21 DIAGNOSIS — Z1231 Encounter for screening mammogram for malignant neoplasm of breast: Secondary | ICD-10-CM

## 2015-07-24 ENCOUNTER — Other Ambulatory Visit: Payer: Self-pay | Admitting: Pulmonary Disease

## 2015-07-24 DIAGNOSIS — R928 Other abnormal and inconclusive findings on diagnostic imaging of breast: Secondary | ICD-10-CM

## 2015-08-12 ENCOUNTER — Ambulatory Visit (HOSPITAL_COMMUNITY)
Admission: RE | Admit: 2015-08-12 | Discharge: 2015-08-12 | Disposition: A | Discharge status: Home / Self Care | Payer: Managed Care, Other (non HMO) | Source: Ambulatory Visit | Attending: Pulmonary Disease | Admitting: Pulmonary Disease

## 2015-08-12 ENCOUNTER — Encounter (INDEPENDENT_AMBULATORY_CARE_PROVIDER_SITE_OTHER): Payer: Self-pay | Admitting: Internal Medicine

## 2015-08-12 ENCOUNTER — Encounter (INDEPENDENT_AMBULATORY_CARE_PROVIDER_SITE_OTHER): Payer: Self-pay | Admitting: *Deleted

## 2015-08-12 DIAGNOSIS — R928 Other abnormal and inconclusive findings on diagnostic imaging of breast: Secondary | ICD-10-CM | POA: Diagnosis present

## 2015-08-12 DIAGNOSIS — N63 Unspecified lump in breast: Secondary | ICD-10-CM | POA: Insufficient documentation

## 2015-10-06 ENCOUNTER — Ambulatory Visit (INDEPENDENT_AMBULATORY_CARE_PROVIDER_SITE_OTHER): Payer: Managed Care, Other (non HMO)

## 2015-10-06 ENCOUNTER — Ambulatory Visit (INDEPENDENT_AMBULATORY_CARE_PROVIDER_SITE_OTHER): Payer: Managed Care, Other (non HMO) | Admitting: Obstetrics & Gynecology

## 2015-10-06 ENCOUNTER — Encounter: Payer: Self-pay | Admitting: Obstetrics & Gynecology

## 2015-10-06 ENCOUNTER — Other Ambulatory Visit: Payer: Self-pay | Admitting: Obstetrics & Gynecology

## 2015-10-06 VITALS — BP 120/80 | HR 72 | Ht 63.4 in | Wt 175.0 lb

## 2015-10-06 DIAGNOSIS — N83202 Unspecified ovarian cyst, left side: Secondary | ICD-10-CM

## 2015-10-06 DIAGNOSIS — N854 Malposition of uterus: Secondary | ICD-10-CM

## 2015-10-06 NOTE — Progress Notes (Signed)
Follow up appointment for results  Chief Complaint  Patient presents with  . Follow-up    ultrasound    Blood pressure 120/80, pulse 72, height 5' 3.4" (1.61 m), weight 175 lb (79.4 kg).  US Transvaginal Non-ob  Result Date: 10/06/2015 GYNECOLOGIC SONOGRAM Kimberly Fitzpatrick is a 45 y.o. s/p endometrial ablation,she is here for a pelvic sonogram to f/u left ovarian cyst. Uterus                      7.5 x 5.1 x 4.1 cm,  heterogeneous anteverted uterus, Endometrium          4.2  mm, symmetrical, trace of fluid w/in the endometrium Right ovary             2.3 x 2.5 x 1.3 cm, wnl Left ovary                3.2 x 3.2 x 1.7 cm, wnl Technician Comments: PELVIC US TA/TV: heterogeneous anteverted uterus,EEC 4.2 mm,a trace of fluid w/in the endometrium,normal ov's bilat,no pain during ultrasound,ov's appear mobile,no free fluid [employer] 10/06/2015 4:21 PM Clinical Impression and recommendations: I have reviewed the sonogram results above, combined with the patient's current clinical course, below are my impressions and any appropriate recommendations for management based on the sonographic findings. Resolved left ovarian cyst Normal uterus endometrium and both ovaries No free fluid Cloy Cozzens H 10/06/2015 4:54 PM   US Pelvis Complete  Result Date: 10/06/2015 GYNECOLOGIC SONOGRAM Kimberly Fitzpatrick is a 45 y.o. s/p endometrial ablation,she is here for a pelvic sonogram to f/u left ovarian cyst. Uterus                      7.5 x 5.1 x 4.1 cm,  heterogeneous anteverted uterus, Endometrium          4.2  mm, symmetrical, trace of fluid w/in the endometrium Right ovary             2.3 x 2.5 x 1.3 cm, wnl Left ovary                3.2 x 3.2 x 1.7 cm, wnl Technician Comments: PELVIC US TA/TV: heterogeneous anteverted uterus,EEC 4.2 mm,a trace of fluid w/in the endometrium,normal ov's bilat,no pain during ultrasound,ov's appear mobile,no free fluid [employer] 10/06/2015 4:21 PM Clinical Impression and recommendations: I  have reviewed the sonogram results above, combined with the patient's current clinical course, below are my impressions and any appropriate recommendations for management based on the sonographic findings. Resolved left ovarian cyst Normal uterus endometrium and both ovaries No free fluid Terianna Peggs H 10/06/2015 4:54 PM      MEDS ordered this encounter: No orders of the defined types were placed in this encounter.   Orders for this encounter: No orders of the defined types were placed in this encounter.   Impression: Cyst of left ovary resolved   Plan: No follow up needed Try patches for the left sided pain, myofacial  Follow Up: Return if symptoms worsen or fail to improve.       Face to face time:  10 minutes  Greater than 50% of the visit time was spent in counseling and coordination of care with the patient.  The summary and outline of the counseling and care coordination is summarized in the note above.   All questions were answered.  Past Medical History:  Diagnosis Date  . Breast cyst, right   .  Chest pain   . Chronic pain in shoulder   . GERD (gastroesophageal reflux disease)   . History of gastrointestinal bleeding   . Hypertension   . Low back pain   . Varicose veins  painful varicosities left leg    Past Surgical History:  Procedure Laterality Date  . ANTERIOR CRUCIATE LIGAMENT REPAIR  right knee  1999  . COLONOSCOPY N/A 05/23/2015   Procedure: COLONOSCOPY;  Surgeon: Rogene Houston, MD;  [facility];  Service: Endoscopy;  Laterality: N/A;  1:10  . DILATION AND CURETTAGE OF UTERUS    . KNEE CARTILAGE SURGERY  right knee 1987   right knee 1999  . TUBAL LIGATION  1997  . uterine ablation      OB History    No data available      No Known Allergies  Social History   Social History  . Marital status: Single    Spouse name: N/A  . Number of children: N/A  . Years of education: N/A   Social History Main Topics  . Smoking  status: Current Every Day Smoker    Packs/day: 0.50    Years: 25.00  . Smokeless tobacco: Current User     Comment: 1/2 to 1 pack a day x 26 yrs.   . Alcohol use 0.0 oz/week     Comment: occasionallly  . Drug use: No  . Sexual activity: No   Other Topics Concern  . None   Social History Narrative  . None    Family History  Problem Relation Age of Onset  . Cancer Maternal Grandmother

## 2015-10-06 NOTE — Progress Notes (Signed)
PELVIC US TA/TV: heterogeneous anteverted uterus,EEC 4.2 mm,a trace of fluid w/in the endometrium,normal ov's bilat,no pain during ultrasound,ov's appear mobile,no free fluid

## 2015-11-06 ENCOUNTER — Other Ambulatory Visit (HOSPITAL_COMMUNITY): Payer: Self-pay | Admitting: Pulmonary Disease

## 2015-11-06 ENCOUNTER — Ambulatory Visit (HOSPITAL_COMMUNITY)
Admission: RE | Admit: 2015-11-06 | Discharge: 2015-11-06 | Disposition: A | Discharge status: Home / Self Care | Payer: Managed Care, Other (non HMO) | Source: Ambulatory Visit | Attending: Pulmonary Disease | Admitting: Pulmonary Disease

## 2015-11-06 DIAGNOSIS — S92911A Unspecified fracture of right toe(s), initial encounter for closed fracture: Secondary | ICD-10-CM | POA: Insufficient documentation

## 2015-11-06 DIAGNOSIS — X58XXXA Exposure to other specified factors, initial encounter: Secondary | ICD-10-CM | POA: Insufficient documentation

## 2015-11-06 DIAGNOSIS — S99921A Unspecified injury of right foot, initial encounter: Secondary | ICD-10-CM

## 2016-02-19 ENCOUNTER — Other Ambulatory Visit (HOSPITAL_COMMUNITY): Payer: Self-pay | Admitting: Pulmonary Disease

## 2016-02-19 DIAGNOSIS — R928 Other abnormal and inconclusive findings on diagnostic imaging of breast: Secondary | ICD-10-CM

## 2016-03-09 ENCOUNTER — Ambulatory Visit (HOSPITAL_COMMUNITY)
Admission: RE | Admit: 2016-03-09 | Discharge: 2016-03-09 | Disposition: A | Discharge status: Home / Self Care | Payer: No Typology Code available for payment source | Source: Ambulatory Visit | Attending: Pulmonary Disease | Admitting: Pulmonary Disease

## 2016-03-09 DIAGNOSIS — R928 Other abnormal and inconclusive findings on diagnostic imaging of breast: Secondary | ICD-10-CM | POA: Diagnosis present

## 2016-06-10 ENCOUNTER — Other Ambulatory Visit: Payer: Self-pay | Admitting: *Deleted

## 2016-07-27 ENCOUNTER — Encounter: Payer: Self-pay | Admitting: Vascular Surgery

## 2016-08-13 ENCOUNTER — Ambulatory Visit: Payer: Self-pay | Admitting: Urology

## 2016-08-16 ENCOUNTER — Ambulatory Visit (INDEPENDENT_AMBULATORY_CARE_PROVIDER_SITE_OTHER): Payer: No Typology Code available for payment source | Admitting: Vascular Surgery

## 2016-08-16 VITALS — BP 130/90 | HR 82 | Temp 97.6°F | Resp 16 | Ht 64.0 in | Wt 176.0 lb

## 2016-08-16 DIAGNOSIS — I83892 Varicose veins of left lower extremities with other complications: Secondary | ICD-10-CM | POA: Insufficient documentation

## 2016-08-16 NOTE — Progress Notes (Signed)
Subjective:     Patient ID: Kimberly Fitzpatrick, female   DOB: January 05, 1970, 46 y.o.   MRN: [id number]  HPI This 46 year old female was referred by Dr. Sinda Du for evaluation of varicose veins in the left leg. The patient previously was evaluated by Dr. Sherren Mocha early in 2012 and at that time the left great saphenous vein was not large enough to qualify for laser ablation and there was not significant reflux. Patient elected to have no sclero- therapy performed. Since then she has had some increasing symptoms in the lateral thigh on the left. Her job requires her to stand most of the day and this worsens or symptoms. She does not relate compression stockings. She has no history of DVT thrombophlebitis stasis ulcers or bleeding. She has no symptoms in the contralateral right leg. She has no history of clotting disorders.  Past Medical History:  Diagnosis Date  . Breast cyst, right   . Chest pain   . Chronic pain in shoulder   . GERD (gastroesophageal reflux disease)   . History of gastrointestinal bleeding   . Hypertension   . Low back pain   . Varicose veins  painful varicosities left leg    Social History  Substance Use Topics  . Smoking status: Current Every Day Smoker    Packs/day: 0.50    Years: 25.00  . Smokeless tobacco: Current User     Comment: 1/2 to 1 pack a day x 26 yrs.   . Alcohol use 0.0 oz/week     Comment: occasionallly    Family History  Problem Relation Age of Onset  . Cancer Maternal Grandmother     No Known Allergies   Current Outpatient Prescriptions:  .  ALPRAZolam (XANAX) 0.5 MG tablet, Take 0.5 mg by mouth at bedtime as needed for anxiety., Disp: , Rfl:  .  gabapentin (NEURONTIN) 300 MG capsule, Take 300 mg by mouth 3 (three) times daily., Disp: , Rfl:  .  HYDROcodone-acetaminophen (NORCO) 7.5-325 MG tablet, Take 1 tablet by mouth every 6 (six) hours as needed for moderate pain. , Disp: , Rfl:  .  irbesartan (AVAPRO) 300 MG tablet, , Disp: , Rfl:  .   meloxicam (MOBIC) 15 MG tablet, Take 15 mg by mouth daily., Disp: , Rfl:  .  olmesartan (BENICAR) 40 MG tablet, Take 40 mg by mouth daily., Disp: , Rfl:  .  olmesartan-hydrochlorothiazide (BENICAR HCT) 40-12.5 MG tablet, , Disp: , Rfl:  .  omeprazole (PRILOSEC) 20 MG capsule, Take 20 mg by mouth daily.  , Disp: , Rfl:  .  PARoxetine (PAXIL) 20 MG tablet, Take 20 mg by mouth daily. Reported on 05/06/2015, Disp: , Rfl:  .  CINNAMON PO, Take 1 capsule by mouth daily., Disp: , Rfl:  .  ciprofloxacin (CIPRO) 250 MG tablet, , Disp: , Rfl:   Vitals:   08/16/16 1301  BP: 130/90  Pulse: 82  Resp: 16  Temp: 97.6 F (36.4 C)  SpO2: 96%  Weight: 176 lb (79.8 kg)  Height: 5\' 4"  (1.626 m)    Body mass index is 30.21 kg/m.         Review of Systems Has history of occasional chest discomfort. Has hypertension. Denies claudication or hemoptysis    Objective:   Physical Exam BP 130/90 (BP Location: Left Arm, Patient Position: Sitting, Cuff Size: Normal)   Pulse 82   Temp 97.6 F (36.4 C)   Resp 16   Ht 5\' 4"  (1.626 m)   Wt  176 lb (79.8 kg)   SpO2 96%   BMI 30.21 kg/m     Gen.-alert and oriented x3 in no apparent distress HEENT normal for age Lungs no rhonchi or wheezing Cardiovascular regular rhythm no murmurs carotid pulses 3+ palpable no bruits audible Abdomen soft nontender no palpable masses Musculoskeletal free of  major deformities Skin clear -no rashes Neurologic normal Lower extremities 3+ femoral and dorsalis pedis pulses palpable bilaterally with no edema Left leg with varicosities beginning in the anterior proximal thigh extending laterally into the lateral thigh and into the lateral calf all of which are relatively small-0.5 cm-in diameter. She does have a patch of telangiectasia in the lateral distal thigh. No hyperpigmentation or ulcerations noted. No bulging varicosities on the medial calf area.  Today I performed a bedside SonoSite ultrasound exam which revealed  the left great saphenous vein to the of normal size with no obvious reflux on my exam  This compares very similarly to the study done in 2012 which I reviewed       Assessment:     Varicose veins left leg with telangiectasia but no evidence of significant reflux or enlargement left great saphenous system on bedside ultrasound exam    Plan:     Discussed with patient the treatment would be commendation of sclerotherapy and possible stab phlebectomy but laser ablation was not indicated this time and therefore insurance would likely not cover this Her questions were answered and she was reassured and if these worsen with time she will be back in touch with [place] for further evaluation

## 2016-08-17 ENCOUNTER — Encounter: Payer: Self-pay | Admitting: Pulmonary Disease

## 2016-10-25 ENCOUNTER — Other Ambulatory Visit (HOSPITAL_COMMUNITY): Payer: Self-pay | Admitting: Pulmonary Disease

## 2016-10-25 DIAGNOSIS — R928 Other abnormal and inconclusive findings on diagnostic imaging of breast: Secondary | ICD-10-CM

## 2016-10-25 DIAGNOSIS — N6310 Unspecified lump in the right breast, unspecified quadrant: Secondary | ICD-10-CM

## 2016-11-16 ENCOUNTER — Encounter (HOSPITAL_COMMUNITY): Payer: Self-pay

## 2016-11-16 ENCOUNTER — Ambulatory Visit (HOSPITAL_COMMUNITY)
Admission: RE | Admit: 2016-11-16 | Discharge: 2016-11-16 | Disposition: A | Discharge status: Home / Self Care | Payer: No Typology Code available for payment source | Source: Ambulatory Visit | Attending: Pulmonary Disease | Admitting: Pulmonary Disease

## 2016-11-16 DIAGNOSIS — N6310 Unspecified lump in the right breast, unspecified quadrant: Secondary | ICD-10-CM

## 2016-11-16 DIAGNOSIS — R928 Other abnormal and inconclusive findings on diagnostic imaging of breast: Secondary | ICD-10-CM | POA: Diagnosis present

## 2016-12-01 IMAGING — MG MM DIGITAL SCREENING BILATERAL
4 series · 4 of 4 positions shown · non-contrast
Comparison: Previous exam(s).

CLINICAL DATA: Screening.

EXAM:
DIGITAL SCREENING BILATERAL MAMMOGRAM WITH CAD

[R CC]
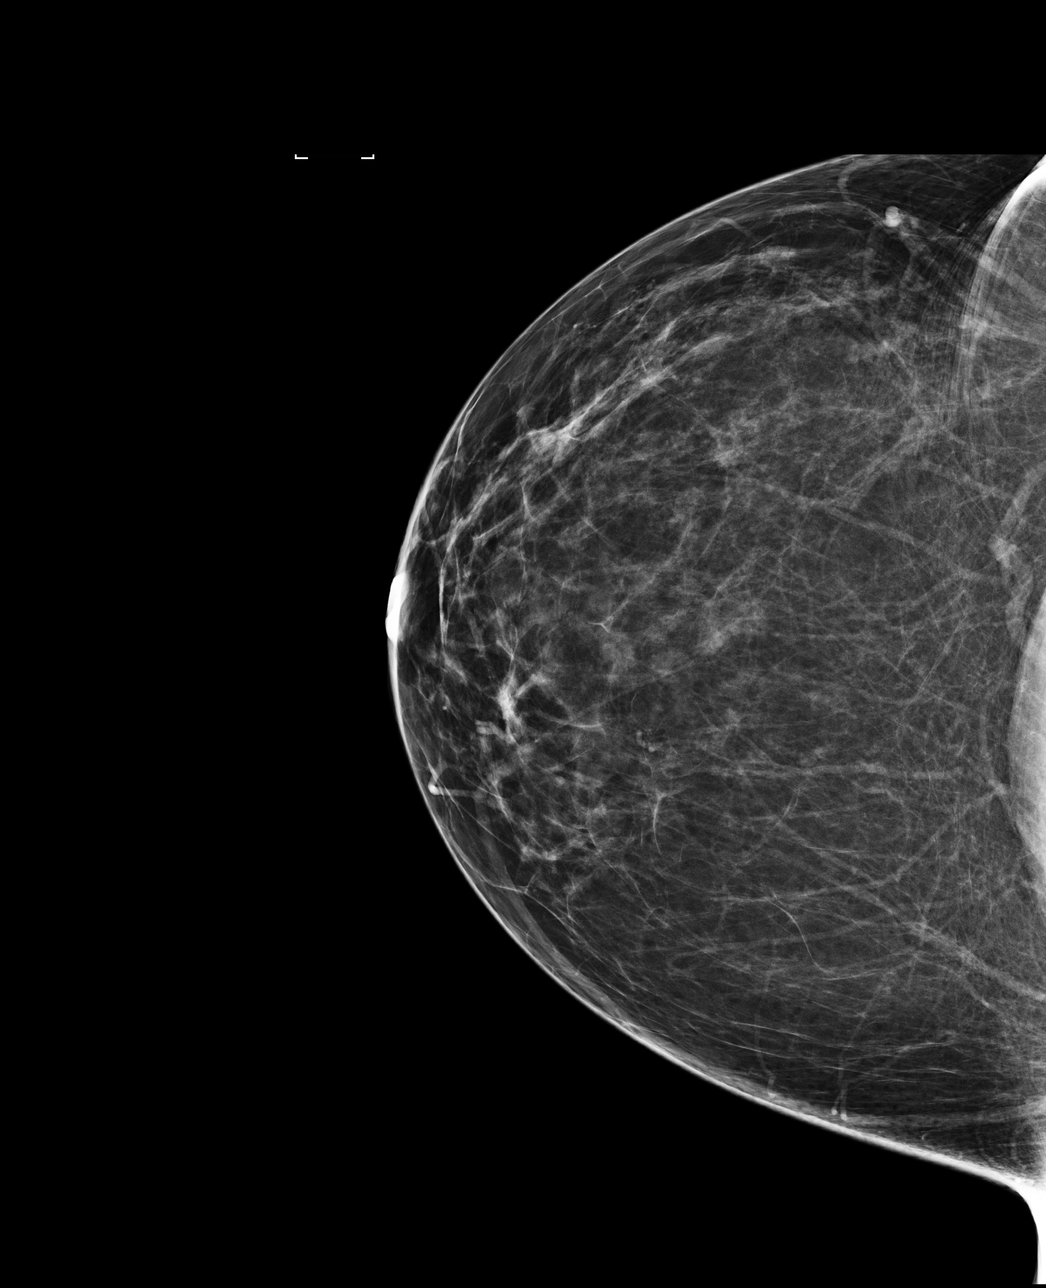

[L MLO]
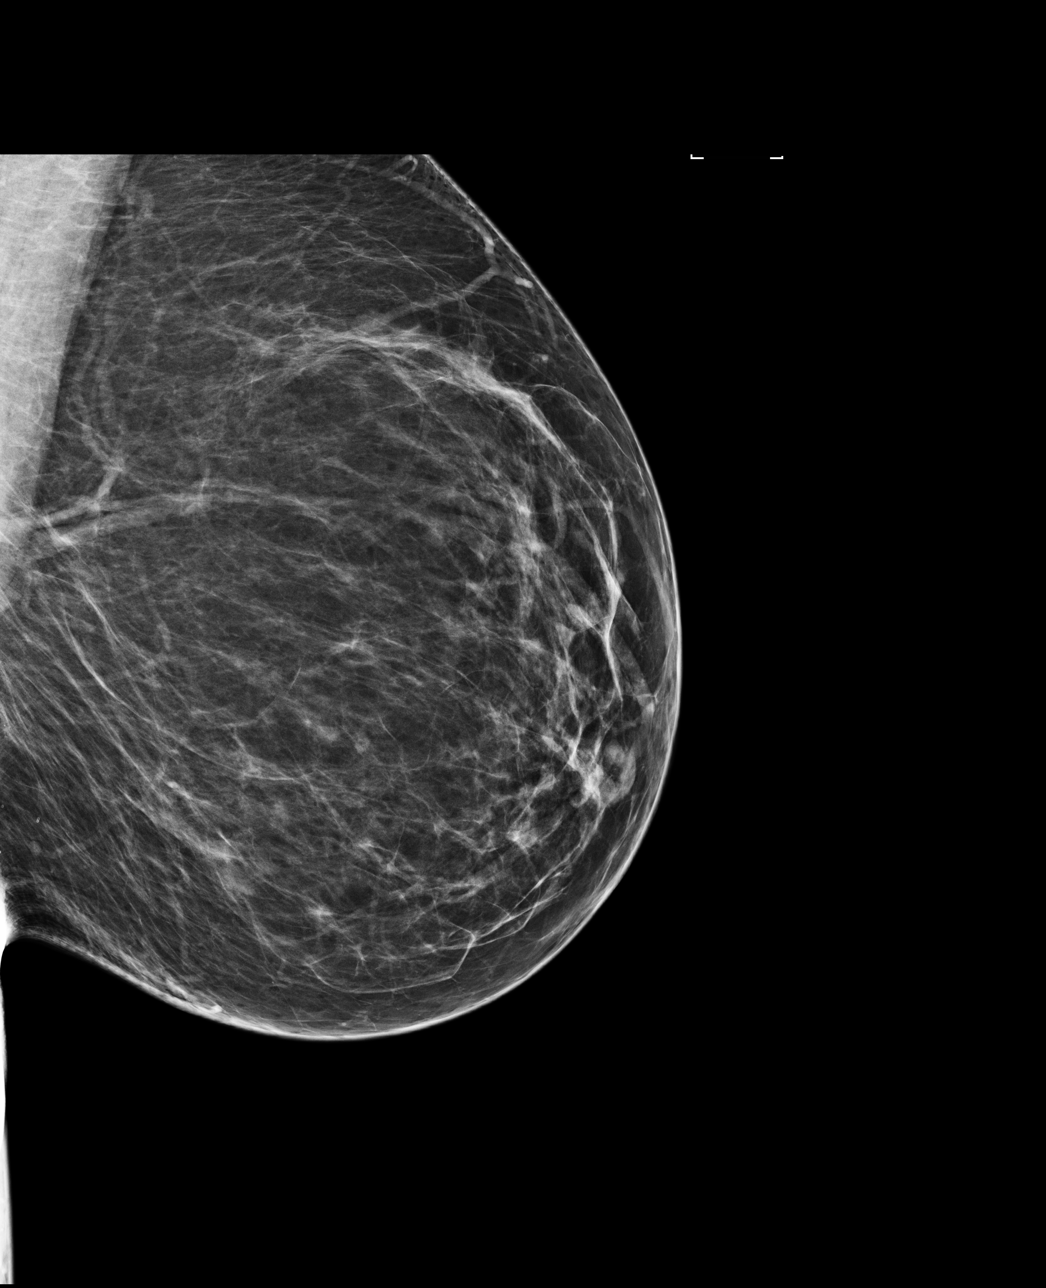

[L CC]
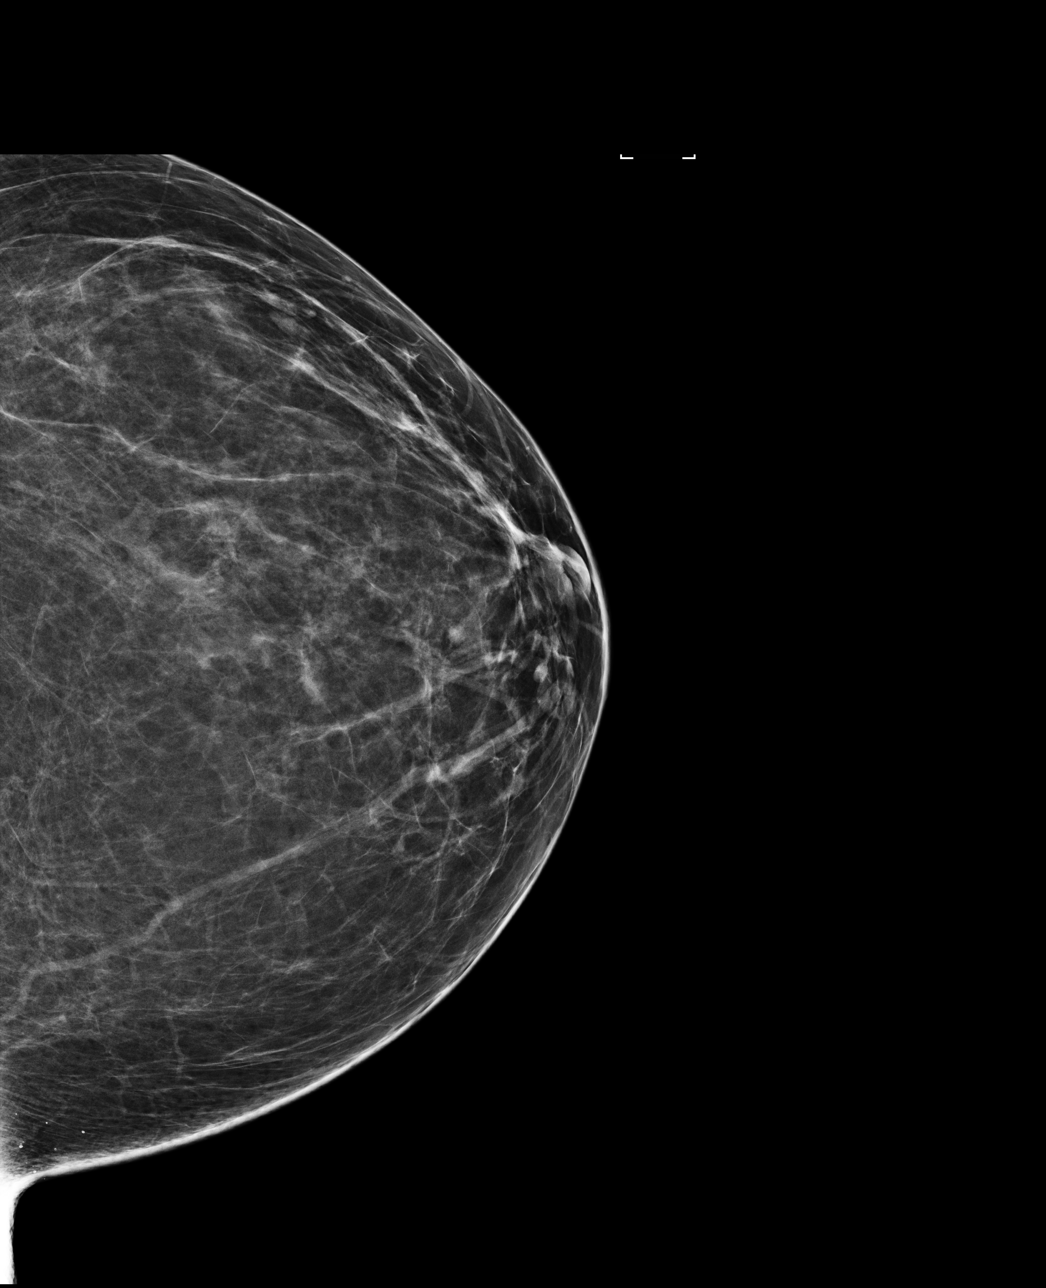

[R MLO]
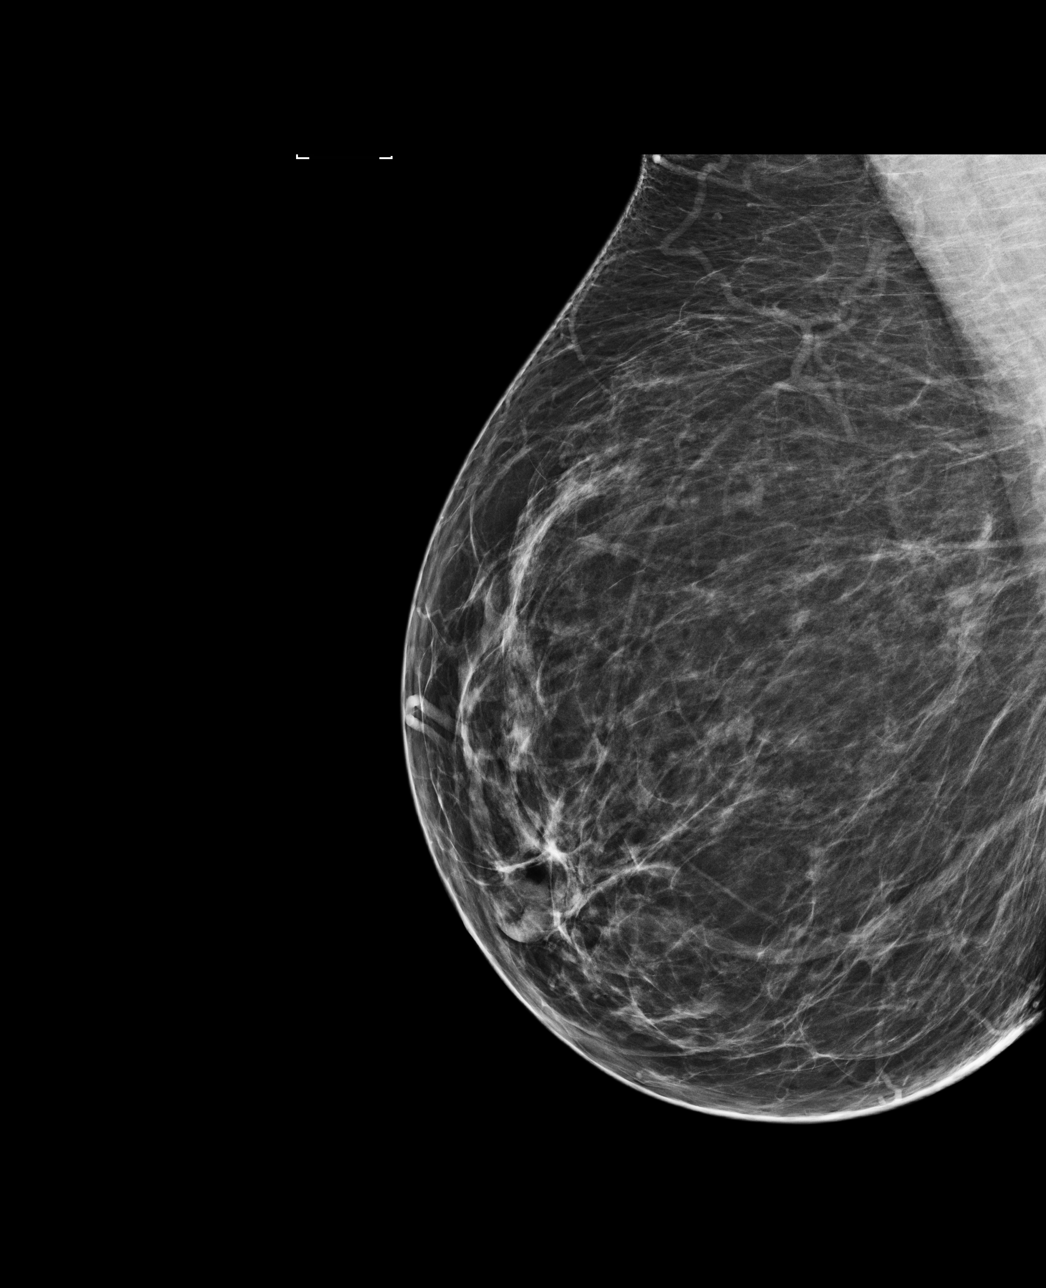

[4 of 4 positions shown; findings below may reference images not displayed]

ACR Breast Density Category b: There are scattered areas of
fibroglandular density.
FINDINGS: There are no findings suspicious for malignancy. Images were
processed with CAD.
IMPRESSION: No mammographic evidence of malignancy. A result letter of this
screening mammogram will be mailed directly to the patient.

RECOMMENDATION:
Screening mammogram in one year. (Code:AS-G-LCT)

BI-RADS CATEGORY  1: Negative.

## 2017-03-09 DIAGNOSIS — Z79891 Long term (current) use of opiate analgesic: Secondary | ICD-10-CM | POA: Diagnosis not present

## 2017-03-10 DIAGNOSIS — I10 Essential (primary) hypertension: Secondary | ICD-10-CM | POA: Diagnosis not present

## 2017-03-10 DIAGNOSIS — G5603 Carpal tunnel syndrome, bilateral upper limbs: Secondary | ICD-10-CM | POA: Diagnosis not present

## 2017-03-10 DIAGNOSIS — F419 Anxiety disorder, unspecified: Secondary | ICD-10-CM | POA: Diagnosis not present

## 2017-03-10 DIAGNOSIS — M545 Low back pain: Secondary | ICD-10-CM | POA: Diagnosis not present

## 2017-03-29 DIAGNOSIS — M79643 Pain in unspecified hand: Secondary | ICD-10-CM | POA: Diagnosis not present

## 2017-03-29 DIAGNOSIS — R208 Other disturbances of skin sensation: Secondary | ICD-10-CM | POA: Diagnosis not present

## 2017-03-29 DIAGNOSIS — M545 Low back pain: Secondary | ICD-10-CM | POA: Diagnosis not present

## 2017-03-29 DIAGNOSIS — G5603 Carpal tunnel syndrome, bilateral upper limbs: Secondary | ICD-10-CM | POA: Diagnosis not present

## 2017-04-07 DIAGNOSIS — G5601 Carpal tunnel syndrome, right upper limb: Secondary | ICD-10-CM | POA: Diagnosis not present

## 2017-04-07 DIAGNOSIS — G5602 Carpal tunnel syndrome, left upper limb: Secondary | ICD-10-CM | POA: Diagnosis not present

## 2017-05-17 DIAGNOSIS — G5603 Carpal tunnel syndrome, bilateral upper limbs: Secondary | ICD-10-CM | POA: Diagnosis not present

## 2017-05-27 DIAGNOSIS — G5603 Carpal tunnel syndrome, bilateral upper limbs: Secondary | ICD-10-CM | POA: Diagnosis not present

## 2017-06-21 DIAGNOSIS — M545 Low back pain: Secondary | ICD-10-CM | POA: Diagnosis not present

## 2017-06-21 DIAGNOSIS — F17200 Nicotine dependence, unspecified, uncomplicated: Secondary | ICD-10-CM | POA: Diagnosis not present

## 2017-06-21 DIAGNOSIS — I10 Essential (primary) hypertension: Secondary | ICD-10-CM | POA: Diagnosis not present

## 2017-06-21 DIAGNOSIS — G5603 Carpal tunnel syndrome, bilateral upper limbs: Secondary | ICD-10-CM | POA: Diagnosis not present

## 2017-06-28 DIAGNOSIS — E669 Obesity, unspecified: Secondary | ICD-10-CM | POA: Diagnosis not present

## 2017-06-28 DIAGNOSIS — N390 Urinary tract infection, site not specified: Secondary | ICD-10-CM | POA: Diagnosis not present

## 2017-06-28 DIAGNOSIS — I10 Essential (primary) hypertension: Secondary | ICD-10-CM | POA: Diagnosis not present

## 2017-07-15 DIAGNOSIS — N390 Urinary tract infection, site not specified: Secondary | ICD-10-CM | POA: Diagnosis not present

## 2017-08-17 DIAGNOSIS — R29898 Other symptoms and signs involving the musculoskeletal system: Secondary | ICD-10-CM | POA: Diagnosis not present

## 2017-08-17 DIAGNOSIS — M79642 Pain in left hand: Secondary | ICD-10-CM | POA: Diagnosis not present

## 2017-08-17 DIAGNOSIS — G5603 Carpal tunnel syndrome, bilateral upper limbs: Secondary | ICD-10-CM | POA: Diagnosis not present

## 2017-08-17 DIAGNOSIS — M79641 Pain in right hand: Secondary | ICD-10-CM | POA: Diagnosis not present

## 2017-09-13 DIAGNOSIS — G5603 Carpal tunnel syndrome, bilateral upper limbs: Secondary | ICD-10-CM | POA: Diagnosis not present

## 2017-09-22 DIAGNOSIS — I10 Essential (primary) hypertension: Secondary | ICD-10-CM | POA: Diagnosis not present

## 2017-09-22 DIAGNOSIS — F419 Anxiety disorder, unspecified: Secondary | ICD-10-CM | POA: Diagnosis not present

## 2017-09-22 DIAGNOSIS — M545 Low back pain: Secondary | ICD-10-CM | POA: Diagnosis not present

## 2017-09-22 DIAGNOSIS — R635 Abnormal weight gain: Secondary | ICD-10-CM | POA: Diagnosis not present

## 2017-09-22 DIAGNOSIS — Z23 Encounter for immunization: Secondary | ICD-10-CM | POA: Diagnosis not present

## 2019-10-17 DIAGNOSIS — Z23 Encounter for immunization: Secondary | ICD-10-CM | POA: Diagnosis not present

## 2019-11-14 DIAGNOSIS — Z23 Encounter for immunization: Secondary | ICD-10-CM | POA: Diagnosis not present

## 2020-02-11 DIAGNOSIS — R7309 Other abnormal glucose: Secondary | ICD-10-CM | POA: Diagnosis not present

## 2020-02-11 DIAGNOSIS — K219 Gastro-esophageal reflux disease without esophagitis: Secondary | ICD-10-CM | POA: Diagnosis not present

## 2020-02-11 DIAGNOSIS — Z79899 Other long term (current) drug therapy: Secondary | ICD-10-CM | POA: Diagnosis not present

## 2020-02-11 DIAGNOSIS — M4698 Unspecified inflammatory spondylopathy, sacral and sacrococcygeal region: Secondary | ICD-10-CM | POA: Diagnosis not present

## 2020-02-11 DIAGNOSIS — I10 Essential (primary) hypertension: Secondary | ICD-10-CM | POA: Diagnosis not present

## 2020-02-11 DIAGNOSIS — F419 Anxiety disorder, unspecified: Secondary | ICD-10-CM | POA: Diagnosis not present

## 2020-02-11 DIAGNOSIS — M5136 Other intervertebral disc degeneration, lumbar region: Secondary | ICD-10-CM | POA: Diagnosis not present

## 2020-02-11 DIAGNOSIS — R946 Abnormal results of thyroid function studies: Secondary | ICD-10-CM | POA: Diagnosis not present

## 2020-02-11 DIAGNOSIS — Z0000 Encounter for general adult medical examination without abnormal findings: Secondary | ICD-10-CM | POA: Diagnosis not present

## 2020-02-25 DIAGNOSIS — K219 Gastro-esophageal reflux disease without esophagitis: Secondary | ICD-10-CM | POA: Diagnosis not present

## 2020-02-25 DIAGNOSIS — Z0000 Encounter for general adult medical examination without abnormal findings: Secondary | ICD-10-CM | POA: Diagnosis not present

## 2020-02-25 DIAGNOSIS — F419 Anxiety disorder, unspecified: Secondary | ICD-10-CM | POA: Diagnosis not present

## 2020-02-25 DIAGNOSIS — I10 Essential (primary) hypertension: Secondary | ICD-10-CM | POA: Diagnosis not present

## 2020-05-28 DIAGNOSIS — I10 Essential (primary) hypertension: Secondary | ICD-10-CM | POA: Diagnosis not present

## 2020-05-28 DIAGNOSIS — E7800 Pure hypercholesterolemia, unspecified: Secondary | ICD-10-CM | POA: Diagnosis not present

## 2021-04-13 DIAGNOSIS — G4489 Other headache syndrome: Secondary | ICD-10-CM | POA: Diagnosis not present

## 2021-04-13 DIAGNOSIS — I10 Essential (primary) hypertension: Secondary | ICD-10-CM | POA: Diagnosis not present

## 2021-04-28 DIAGNOSIS — K219 Gastro-esophageal reflux disease without esophagitis: Secondary | ICD-10-CM | POA: Diagnosis not present

## 2021-04-28 DIAGNOSIS — F330 Major depressive disorder, recurrent, mild: Secondary | ICD-10-CM | POA: Diagnosis not present

## 2021-04-28 DIAGNOSIS — I10 Essential (primary) hypertension: Secondary | ICD-10-CM | POA: Diagnosis not present

## 2021-04-28 DIAGNOSIS — G629 Polyneuropathy, unspecified: Secondary | ICD-10-CM | POA: Diagnosis not present

## 2021-04-30 DIAGNOSIS — I10 Essential (primary) hypertension: Secondary | ICD-10-CM | POA: Diagnosis not present

## 2021-04-30 DIAGNOSIS — Z131 Encounter for screening for diabetes mellitus: Secondary | ICD-10-CM | POA: Diagnosis not present

## 2021-05-05 DIAGNOSIS — I10 Essential (primary) hypertension: Secondary | ICD-10-CM | POA: Diagnosis not present

## 2021-05-05 DIAGNOSIS — K219 Gastro-esophageal reflux disease without esophagitis: Secondary | ICD-10-CM | POA: Diagnosis not present

## 2021-05-05 DIAGNOSIS — G629 Polyneuropathy, unspecified: Secondary | ICD-10-CM | POA: Diagnosis not present

## 2021-05-05 DIAGNOSIS — F330 Major depressive disorder, recurrent, mild: Secondary | ICD-10-CM | POA: Diagnosis not present

## 2021-07-01 DIAGNOSIS — J069 Acute upper respiratory infection, unspecified: Secondary | ICD-10-CM | POA: Diagnosis not present

## 2021-08-07 DIAGNOSIS — R7303 Prediabetes: Secondary | ICD-10-CM | POA: Diagnosis not present

## 2021-08-07 DIAGNOSIS — I10 Essential (primary) hypertension: Secondary | ICD-10-CM | POA: Diagnosis not present

## 2021-08-14 DIAGNOSIS — K219 Gastro-esophageal reflux disease without esophagitis: Secondary | ICD-10-CM | POA: Diagnosis not present

## 2021-08-14 DIAGNOSIS — F330 Major depressive disorder, recurrent, mild: Secondary | ICD-10-CM | POA: Diagnosis not present

## 2021-08-14 DIAGNOSIS — G629 Polyneuropathy, unspecified: Secondary | ICD-10-CM | POA: Diagnosis not present

## 2021-08-14 DIAGNOSIS — I10 Essential (primary) hypertension: Secondary | ICD-10-CM | POA: Diagnosis not present

## 2021-09-01 ENCOUNTER — Other Ambulatory Visit (HOSPITAL_COMMUNITY): Payer: Self-pay | Admitting: Family Medicine

## 2021-09-01 DIAGNOSIS — Z1231 Encounter for screening mammogram for malignant neoplasm of breast: Secondary | ICD-10-CM

## 2021-09-03 ENCOUNTER — Other Ambulatory Visit (HOSPITAL_COMMUNITY): Payer: Self-pay | Admitting: Family Medicine

## 2021-09-03 DIAGNOSIS — N6310 Unspecified lump in the right breast, unspecified quadrant: Secondary | ICD-10-CM

## 2021-09-09 ENCOUNTER — Ambulatory Visit (HOSPITAL_COMMUNITY)
Admission: RE | Admit: 2021-09-09 | Discharge: 2021-09-09 | Disposition: A | Discharge status: Home / Self Care | Payer: BC Managed Care – PPO | Source: Ambulatory Visit | Attending: Family Medicine | Admitting: Family Medicine

## 2021-09-09 ENCOUNTER — Encounter (HOSPITAL_COMMUNITY): Payer: Self-pay

## 2021-09-09 DIAGNOSIS — R928 Other abnormal and inconclusive findings on diagnostic imaging of breast: Secondary | ICD-10-CM | POA: Diagnosis not present

## 2021-09-09 DIAGNOSIS — N6310 Unspecified lump in the right breast, unspecified quadrant: Secondary | ICD-10-CM | POA: Diagnosis not present

## 2021-09-09 DIAGNOSIS — N6489 Other specified disorders of breast: Secondary | ICD-10-CM | POA: Diagnosis not present

## 2021-09-14 DIAGNOSIS — J069 Acute upper respiratory infection, unspecified: Secondary | ICD-10-CM | POA: Diagnosis not present

## 2021-11-20 DIAGNOSIS — R7303 Prediabetes: Secondary | ICD-10-CM | POA: Diagnosis not present

## 2021-11-20 DIAGNOSIS — I10 Essential (primary) hypertension: Secondary | ICD-10-CM | POA: Diagnosis not present

## 2021-11-24 DIAGNOSIS — Z0000 Encounter for general adult medical examination without abnormal findings: Secondary | ICD-10-CM | POA: Diagnosis not present

## 2021-11-24 DIAGNOSIS — K219 Gastro-esophageal reflux disease without esophagitis: Secondary | ICD-10-CM | POA: Diagnosis not present

## 2021-11-24 DIAGNOSIS — F330 Major depressive disorder, recurrent, mild: Secondary | ICD-10-CM | POA: Diagnosis not present

## 2021-11-24 DIAGNOSIS — G629 Polyneuropathy, unspecified: Secondary | ICD-10-CM | POA: Diagnosis not present

## 2021-12-24 DIAGNOSIS — M461 Sacroiliitis, not elsewhere classified: Secondary | ICD-10-CM | POA: Diagnosis not present

## 2022-02-12 DIAGNOSIS — M461 Sacroiliitis, not elsewhere classified: Secondary | ICD-10-CM | POA: Diagnosis not present

## 2022-03-03 DIAGNOSIS — I10 Essential (primary) hypertension: Secondary | ICD-10-CM | POA: Diagnosis not present

## 2022-03-03 DIAGNOSIS — R7303 Prediabetes: Secondary | ICD-10-CM | POA: Diagnosis not present

## 2022-03-10 DIAGNOSIS — G629 Polyneuropathy, unspecified: Secondary | ICD-10-CM | POA: Diagnosis not present

## 2022-03-10 DIAGNOSIS — I10 Essential (primary) hypertension: Secondary | ICD-10-CM | POA: Diagnosis not present

## 2022-03-10 DIAGNOSIS — F330 Major depressive disorder, recurrent, mild: Secondary | ICD-10-CM | POA: Diagnosis not present

## 2022-03-10 DIAGNOSIS — K219 Gastro-esophageal reflux disease without esophagitis: Secondary | ICD-10-CM | POA: Diagnosis not present

## 2022-03-10 DIAGNOSIS — R7303 Prediabetes: Secondary | ICD-10-CM | POA: Diagnosis not present

## 2022-03-16 DIAGNOSIS — M461 Sacroiliitis, not elsewhere classified: Secondary | ICD-10-CM | POA: Diagnosis not present

## 2022-09-02 DIAGNOSIS — G629 Polyneuropathy, unspecified: Secondary | ICD-10-CM | POA: Diagnosis not present

## 2022-09-02 DIAGNOSIS — R569 Unspecified convulsions: Secondary | ICD-10-CM | POA: Diagnosis not present

## 2022-09-02 DIAGNOSIS — Z79899 Other long term (current) drug therapy: Secondary | ICD-10-CM | POA: Diagnosis not present

## 2022-09-07 DIAGNOSIS — I10 Essential (primary) hypertension: Secondary | ICD-10-CM | POA: Diagnosis not present

## 2022-09-07 DIAGNOSIS — R7303 Prediabetes: Secondary | ICD-10-CM | POA: Diagnosis not present

## 2022-09-13 ENCOUNTER — Other Ambulatory Visit (HOSPITAL_COMMUNITY): Payer: Self-pay | Admitting: Family Medicine

## 2022-09-13 DIAGNOSIS — I10 Essential (primary) hypertension: Secondary | ICD-10-CM | POA: Diagnosis not present

## 2022-09-13 DIAGNOSIS — E785 Hyperlipidemia, unspecified: Secondary | ICD-10-CM | POA: Diagnosis not present

## 2022-09-13 DIAGNOSIS — R569 Unspecified convulsions: Secondary | ICD-10-CM | POA: Diagnosis not present

## 2022-09-13 DIAGNOSIS — G629 Polyneuropathy, unspecified: Secondary | ICD-10-CM | POA: Diagnosis not present

## 2022-09-13 DIAGNOSIS — Z720 Tobacco use: Secondary | ICD-10-CM

## 2023-01-20 ENCOUNTER — Ambulatory Visit: Payer: BC Managed Care – PPO | Admitting: Neurology

## 2023-01-20 ENCOUNTER — Encounter: Payer: Self-pay | Admitting: Neurology

## 2023-01-20 VITALS — BP 141/88 | HR 100 | Ht 62.5 in | Wt 251.5 lb

## 2023-01-20 DIAGNOSIS — R054 Cough syncope: Secondary | ICD-10-CM | POA: Diagnosis not present

## 2023-01-20 DIAGNOSIS — R55 Syncope and collapse: Secondary | ICD-10-CM | POA: Diagnosis not present

## 2023-01-20 DIAGNOSIS — R569 Unspecified convulsions: Secondary | ICD-10-CM | POA: Diagnosis not present

## 2023-01-20 NOTE — Progress Notes (Signed)
[facility]  PATIENT: Kimberly Fitzpatrick DOB: 03-19-1970  REQUESTING CLINICIAN: Leone Payor, FNP HISTORY FROM: Patient nd Boyfriend REASON FOR VISIT: Syncope/Seizure like activity    HISTORICAL  CHIEF COMPLAINT:  Chief Complaint  Patient presents with   New Patient (Initial Visit)    Rm12, boyfriend michael present, New Pt Referral from/Eboni Ibsen NP [phone] for seizure: 2-3 occurred last night and pt denied missing sz meds    HISTORY OF PRESENT ILLNESS:  This is a 53 year old woman past medical history of anxiety/depression, hypertension hyperlipidemia GERD, obesity, current smoker who is presenting with events concerning for seizure.  Patient reports since the end of August she has been having these events that started usually with a coughing fit, then her body will stiffen up, then she will lose conscious and jerk.  After these events, she might have urinary incontinence and on certain occasion, she had bowel incontinence.  They tell me almost all these events are preceded by a coughing fit but she did have events that were not preceded by coughing. Boyfriend tells me that when she wakes up she has a blank stare, mildly confused, these episode lasted less than a minute.  They deny any generalized convulsion but her arms will draw up and she will be stiff.   She has a family history of seizure in a grandfather and aunt but denies any previous history of seizures, no history of head trauma brain infection or inflammation.  She had an event during our visit. It started with patient having deep cough, then she turn red, was holding her breath. She actually did not pass out and she was responsive. Episode lasted about a minute. Boyfriend confirm this is her typical episodes, but this one was mild and she did not pass out.   Handedness: Right handed   Onset: August 2024  Seizure Type: Start with coughing spell, then pass out, become stiff. A few episodes  occur without the coughing fit   Current frequency: Daily   Any injuries from seizures: Muscle soreness  Seizure risk factors: Grandfather and Aunt with seizure  Previous ASMs: None   Currenty ASMs: None  ASMs side effects: N/A   Brain Images: Not available for review   Previous EEGs: Not previously done    OTHER MEDICAL CONDITIONS: Hypertension, Hyperlipidemia, GERD, Anxeity/Depression   REVIEW OF SYSTEMS: Full 14 system review of systems performed and negative with exception of: As noted in the HPI   ALLERGIES: No Known Allergies  HOME MEDICATIONS: Outpatient Medications Prior to Visit  Medication Sig Dispense Refill   acetaminophen (TYLENOL) 500 MG tablet Take 500 mg by mouth every 6 (six) hours as needed for mild pain (pain score 1-3), moderate pain (pain score 4-6), headache or fever.     amLODipine (NORVASC) 10 MG tablet Take 10 mg by mouth daily.     atorvastatin (LIPITOR) 10 MG tablet Take 10 mg by mouth daily.     Coenzyme Q10 300 MG CAPS Take 1 capsule by mouth daily.     gabapentin (NEURONTIN) 300 MG capsule Take 300 mg by mouth 3 (three) times daily.     meloxicam (MOBIC) 15 MG tablet Take 15 mg by mouth daily.     olmesartan-hydrochlorothiazide (BENICAR HCT) 40-12.5 MG tablet      omeprazole (PRILOSEC) 20 MG capsule Take 20 mg by mouth daily.       PARoxetine (PAXIL) 20 MG tablet Take 20 mg by mouth daily. Reported on 05/06/2015  propranolol (INDERAL) 10 MG tablet Take 10 mg by mouth 3 (three) times daily.     traZODone (DESYREL) 50 MG tablet Take 50 mg by mouth daily.     ALPRAZolam (XANAX) 0.5 MG tablet Take 0.5 mg by mouth at bedtime as needed for anxiety.     CINNAMON PO Take 1 capsule by mouth daily.     ciprofloxacin (CIPRO) 250 MG tablet      HYDROcodone-acetaminophen (NORCO) 7.5-325 MG tablet Take 1 tablet by mouth every 6 (six) hours as needed for moderate pain.      irbesartan (AVAPRO) 300 MG tablet      olmesartan (BENICAR) 40 MG tablet Take 40 mg  by mouth daily.     No facility-administered medications prior to visit.    PAST MEDICAL HISTORY: Past Medical History:  Diagnosis Date   Breast cyst, right    Chest pain    Chronic pain in shoulder    GERD (gastroesophageal reflux disease)    History of gastrointestinal bleeding    Hypertension    Low back pain    Varicose veins  painful varicosities left leg    PAST SURGICAL HISTORY: Past Surgical History:  Procedure Laterality Date   ANTERIOR CRUCIATE LIGAMENT REPAIR  right knee  1999   COLONOSCOPY N/A 05/23/2015   Procedure: COLONOSCOPY;  Surgeon: Malissa Hippo, MD;  Location: [facility];  Service: Endoscopy;  Laterality: N/A;  1:10   DILATION AND CURETTAGE OF UTERUS     KNEE CARTILAGE SURGERY  right knee 1987   right knee 1999   TUBAL LIGATION  1997   uterine ablation      FAMILY HISTORY: Family History  Problem Relation Age of Onset   Cancer Maternal Grandmother    BRCA 1/2 Neg Hx    Breast cancer Neg Hx     SOCIAL HISTORY: Social History   Socioeconomic History   Marital status: Single    Spouse name: Not on file   Number of children: Not on file   Years of education: Not on file   Highest education level: Not on file  Occupational History   Not on file  Tobacco Use   Smoking status: Every Day    Current packs/day: 0.50    Average packs/day: 0.5 packs/day for 25.0 years (12.5 ttl pk-yrs)    Types: Cigarettes   Smokeless tobacco: Current   Tobacco comments:    1/2 to 1 pack a day x 26 yrs.   Substance and Sexual Activity   Alcohol use: Yes    Alcohol/week: 0.0 standard drinks of alcohol    Comment: occasionallly   Drug use: No   Sexual activity: Never  Other Topics Concern   Not on file  Social History Narrative   Not on file   Social Drivers of Health   Financial Resource Strain: Not on file  Food Insecurity: Not on file  Transportation Needs: Not on file  Physical Activity: Not on file  Stress: Not on file  Social Connections: Not  on file  Intimate Partner Violence: Not on file     PHYSICAL EXAM  GENERAL EXAM/CONSTITUTIONAL: Vitals:  Vitals:   01/20/23 1007  BP: (!) 141/88  Pulse: 100  Weight: 251 lb 8 oz (114.1 kg)  Height: 5' 2.5" (1.588 m)   Body mass index is 45.27 kg/m. Wt Readings from Last 3 Encounters:  01/20/23 251 lb 8 oz (114.1 kg)  08/16/16 176 lb (79.8 kg)  10/06/15 175 lb (79.4 kg)  Patient is in no distress; well developed, nourished and groomed; neck is supple. Obese woman   MUSCULOSKELETAL: Gait, strength, tone, movements noted in Neurologic exam below  NEUROLOGIC: MENTAL STATUS:      No data to display         awake, alert, oriented to person, place and time recent and remote memory intact normal attention and concentration language fluent, comprehension intact, naming intact fund of knowledge appropriate  CRANIAL NERVE:  2nd, 3rd, 4th, 6th - Visual fields full to confrontation, extraocular muscles intact, no nystagmus 5th - facial sensation symmetric 7th - facial strength symmetric 8th - hearing intact 9th - palate elevates symmetrically, uvula midline 11th - shoulder shrug symmetric 12th - tongue protrusion midline  MOTOR:  normal bulk and tone, full strength in the BUE, BLE  SENSORY:  normal and symmetric to light touch  COORDINATION:  finger-nose-finger, fine finger movements normal  GAIT/STATION:  normal   DIAGNOSTIC DATA (LABS, IMAGING, TESTING) - I reviewed patient records, labs, notes, testing and imaging myself where available.  Lab Results  Component Value Date   WBC 9.4 05/05/2011   HGB 15.4 (H) 05/05/2011   HCT 44.8 05/05/2011   MCV 89.1 05/05/2011   PLT 272 05/05/2011      Component Value Date/Time   NA 137 05/05/2011 1230   K 4.4 05/05/2011 1230   CL 101 05/05/2011 1230   CO2 26 05/05/2011 1230   GLUCOSE 97 05/05/2011 1230   BUN 14 05/05/2011 1230   CREATININE 0.83 05/05/2011 1230   CALCIUM 10.2 05/05/2011 1230   PROT 7.3  05/05/2011 1230   ALBUMIN 4.3 05/05/2011 1230   AST 17 05/05/2011 1230   ALT 17 05/05/2011 1230   ALKPHOS 52 05/05/2011 1230   BILITOT 0.5 05/05/2011 1230   GFRNONAA 87 (L) 05/05/2011 1230   GFRAA >90 05/05/2011 1230   No results found for: "CHOL", "HDL", "LDLCALC", "LDLDIRECT", "TRIG" No results found for: "HGBA1C" No results found for: "VITAMINB12" No results found for: "TSH"   ASSESSMENT AND PLAN  53 y.o. year old female  with anxiety/depression, hypertension, hyperlipidemia GERD, obesity, current smoker who is presenting with events concerning for seizure.  These events usually start with coughing fit and patient will pass out.  She actually had an event that I witnessed in the office today.  Based on description of the event, this is likely cough induced syncope.  However she does report feeling weird prior to these events, bowel incontinence and events that are not preceded by cough, will work her up for seizure.  Plan will be to obtain a routine EEG, if normal then we will proceed with ambulatory EEG.  If both tests are normal, then patient most likely has cough induced syncope and she will need to follow with PCP for management of her cough.  She is still at smoker, and I discussed smoking cessation and risk of developing COPD/Emphysema. I did advise her to follow up with PCP/?Pulmonologist for management of her cough. She voiced understanding.  Continue follow-up with PCP and return as needed.   1. Cough syncope   2. Seizure-like activity [facility])     Patient Instructions  Routine EEG, if normal will proceed with ambulatory EEG Continue current medications  Continue to follow up with PCP/?Pulmonologist for management of cough  Discussed smoking cessation  Return as needed   Per [facility] statutes, patients with seizures are not allowed to drive until they have been seizure-free for six months.  Other recommendations  include using caution when using heavy equipment or  power tools. Avoid working on ladders or at heights. Take showers instead of baths.  Do not swim alone.  Ensure the water temperature is not too high on the home water heater. Do not go swimming alone. Do not lock yourself in a room alone (i.e. bathroom). When caring for infants or small children, sit down when holding, feeding, or changing them to minimize risk of injury to the child in the event you have a seizure. Maintain good sleep hygiene. Avoid alcohol.  Also recommend adequate sleep, hydration, good diet and minimize stress.   During the Seizure  - First, ensure adequate ventilation and place patients on the floor on their left side  Loosen clothing around the neck and ensure the airway is patent. If the patient is clenching the teeth, do not force the mouth open with any object as this can cause severe damage - Remove all items from the surrounding that can be hazardous. The patient may be oblivious to what's happening and may not even know what he or she is doing. If the patient is confused and wandering, either gently guide him/her away and block access to outside areas - Reassure the individual and be comforting - Call 911. In most cases, the seizure ends before EMS arrives. However, there are cases when seizures may last over 3 to 5 minutes. Or the individual may have developed breathing difficulties or severe injuries. If a pregnant patient or a person with diabetes develops a seizure, it is prudent to call an ambulance. - Finally, if the patient does not regain full consciousness, then call EMS. Most patients will remain confused for about 45 to 90 minutes after a seizure, so you must use judgment in calling for help. - Avoid restraints but make sure the patient is in a bed with padded side rails - Place the individual in a lateral position with the neck slightly flexed; this will help the saliva drain from the mouth and prevent the tongue from falling backward - Remove all nearby  furniture and other hazards from the area - Provide verbal assurance as the individual is regaining consciousness - Provide the patient with privacy if possible - Call for help and start treatment as ordered by the caregiver   After the Seizure (Postictal Stage)  After a seizure, most patients experience confusion, fatigue, muscle pain and/or a headache. Thus, one should permit the individual to sleep. For the next few days, reassurance is essential. Being calm and helping reorient the person is also of importance.  Most seizures are painless and end spontaneously. Seizures are not harmful to others but can lead to complications such as stress on the lungs, brain and the heart. Individuals with prior lung problems may develop labored breathing and respiratory distress.    Discussed Patients with epilepsy have a small risk of sudden unexpected death, a condition referred to as sudden unexpected death in epilepsy (SUDEP). SUDEP is defined specifically as the sudden, unexpected, witnessed or unwitnessed, nontraumatic and nondrowning death in patients with epilepsy with or without evidence for a seizure, and excluding documented status epilepticus, in which post mortem examination does not reveal a structural or toxicologic cause for death     Orders Placed This Encounter  Procedures   EEG adult    No orders of the defined types were placed in this encounter.   Return if symptoms worsen or fail to improve.    Windell Norfolk, MD 01/20/2023, 12:23 PM  [facility] [address] [place], [place] [address] [phone]

## 2023-01-20 NOTE — Patient Instructions (Addendum)
Routine EEG, if normal will proceed with ambulatory EEG Continue current medications  Continue to follow up with PCP/?Pulmonologist for management of cough  Discussed smoking cessation  Return as needed

## 2023-01-21 DIAGNOSIS — Z8669 Personal history of other diseases of the nervous system and sense organs: Secondary | ICD-10-CM | POA: Diagnosis not present

## 2023-01-21 DIAGNOSIS — R7303 Prediabetes: Secondary | ICD-10-CM | POA: Diagnosis not present

## 2023-01-21 DIAGNOSIS — R059 Cough, unspecified: Secondary | ICD-10-CM | POA: Diagnosis not present

## 2023-02-10 ENCOUNTER — Ambulatory Visit: Payer: BC Managed Care – PPO | Admitting: Neurology

## 2023-02-10 ENCOUNTER — Other Ambulatory Visit (HOSPITAL_COMMUNITY): Payer: Self-pay | Admitting: Radiology

## 2023-02-10 DIAGNOSIS — R55 Syncope and collapse: Secondary | ICD-10-CM | POA: Diagnosis not present

## 2023-02-10 DIAGNOSIS — R569 Unspecified convulsions: Secondary | ICD-10-CM

## 2023-02-14 NOTE — Procedures (Signed)
    History:  53 year old woman with syncope   EEG classification: Awake and drowsy  Duration: 27 minutes   Technical aspects: This EEG study was done with scalp electrodes positioned according to the 10-20 International system of electrode placement. Electrical activity was reviewed with band pass filter of 1-70Hz , sensitivity of 7 uV/mm, display speed of 22mm/sec with a 60Hz  notched filter applied as appropriate. EEG data were recorded continuously and digitally stored.   Description of the recording: The background rhythms of this recording consists of a fairly well modulated medium amplitude alpha rhythm of 11 Hz that is reactive to eye opening and closure. Present in the anterior head region is a 15-20 Hz beta activity. Photic stimulation was performed, did not show any abnormalities. Hyperventilation was also performed, did not show any abnormalities. Drowsiness was manifested by background fragmentation. No abnormal epileptiform discharges seen during this recording. There was no focal slowing. There were no electrographic seizure identified.   Abnormality: None   Impression: This is a normal awake EEG. No evidence of interictal epileptiform discharges. Normal EEGs, however, do not rule out epilepsy.    Hawkins Seaman, MD [facility]

## 2023-02-15 ENCOUNTER — Encounter: Payer: Self-pay | Admitting: Neurology

## 2023-02-15 ENCOUNTER — Other Ambulatory Visit: Payer: Self-pay | Admitting: Neurology

## 2023-02-15 DIAGNOSIS — R569 Unspecified convulsions: Secondary | ICD-10-CM

## 2023-02-16 ENCOUNTER — Telehealth: Payer: Self-pay

## 2023-02-17 ENCOUNTER — Other Ambulatory Visit (HOSPITAL_COMMUNITY): Payer: Self-pay | Admitting: Radiology

## 2023-02-17 DIAGNOSIS — R059 Cough, unspecified: Secondary | ICD-10-CM

## 2023-03-04 DIAGNOSIS — R569 Unspecified convulsions: Secondary | ICD-10-CM | POA: Diagnosis not present

## 2023-03-04 DIAGNOSIS — R404 Transient alteration of awareness: Secondary | ICD-10-CM | POA: Diagnosis not present

## 2023-03-05 DIAGNOSIS — R404 Transient alteration of awareness: Secondary | ICD-10-CM | POA: Diagnosis not present

## 2023-03-05 DIAGNOSIS — R569 Unspecified convulsions: Secondary | ICD-10-CM | POA: Diagnosis not present

## 2023-03-06 DIAGNOSIS — R404 Transient alteration of awareness: Secondary | ICD-10-CM | POA: Diagnosis not present

## 2023-03-06 DIAGNOSIS — R569 Unspecified convulsions: Secondary | ICD-10-CM | POA: Diagnosis not present

## 2023-03-07 DIAGNOSIS — R569 Unspecified convulsions: Secondary | ICD-10-CM

## 2023-03-07 DIAGNOSIS — R404 Transient alteration of awareness: Secondary | ICD-10-CM | POA: Diagnosis not present

## 2023-03-07 NOTE — Progress Notes (Unsigned)
 Kimberly Fitzpatrick, female    DOB: 1970-01-23    MRN: [id number]   Brief patient profile:  19  yowf  active smoker healthy child referred to pulmonary clinic in [place]  03/09/2023 by Lupita Raider  for "breathing spells"    Baseline wt p last IUP @ 150 at 1997   Onset of "sz's" was Aug 2024      History of Present Illness  03/09/2023  Pulmonary/ 1st office eval/ Bertice Risse / Sales executive Complaint  Patient presents with   Consult    Pt has night she wakes up  with deep cough that cause her to stop breathing  her husband has to massage her chest until  until she start to breath again she said that this happen about couple time  and started about 1 year  Dyspnea:  limited by back R leg pain  Cough: minimal AM cough /little rattle sometimes happens with spells sometimes not  Sleep: avg spell when stops breathing every few weeks, once up to 5 x in one night / once sitting in chair in doctors office "not a bad one"  SABA use: up to every several weeks  02: has air supra ? Helps (hfa baseline very poor)  LDSCT per PCP was scheduled     No obvious day to day or daytime pattern/variability or assoc excess/ purulent sputum or mucus plugs or hemoptysis or cp or chest tightness, subjective wheeze or overt sinus or hb symptoms.    Also denies any obvious fluctuation of symptoms with weather or environmental changes or other aggravating or alleviating factors except as outlined above   No unusual exposure hx or h/o childhood pna/ asthma or knowledge of premature birth.  Current Allergies, Complete Past Medical History, Past Surgical History, Family History, and Social History were reviewed in [employer] record.  ROS  The following are not active complaints unless bolded Hoarseness, sore throat, dysphagia, dental problems, itching, sneezing,  nasal congestion or discharge of excess mucus or purulent secretions, ear ache,   fever, chills, sweats, unintended wt  loss or wt gain, classically pleuritic or exertional cp,  orthopnea pnd or arm/hand swelling  or leg swelling, presyncope, palpitations, abdominal pain, anorexia, nausea, vomiting, diarrhea  or change in bowel habits or change in bladder habits, change in stools or change in urine, dysuria, hematuria,  rash, arthralgias, visual complaints, headache, numbness, weakness or ataxia or problems with walking or coordination,  change in mood or  memory.            Outpatient Medications Prior to Visit  Medication Sig Dispense Refill   acetaminophen (TYLENOL) 500 MG tablet Take 500 mg by mouth every 6 (six) hours as needed for mild pain (pain score 1-3), moderate pain (pain score 4-6), headache or fever.     amLODipine (NORVASC) 10 MG tablet Take 10 mg by mouth daily.     atorvastatin (LIPITOR) 10 MG tablet Take 10 mg by mouth daily.     Coenzyme Q10 300 MG CAPS Take 1 capsule by mouth daily.     gabapentin (NEURONTIN) 300 MG capsule Take 300 mg by mouth 3 (three) times daily.     meloxicam (MOBIC) 15 MG tablet Take 15 mg by mouth daily.     olmesartan-hydrochlorothiazide (BENICAR HCT) 40-12.5 MG tablet      omeprazole (PRILOSEC) 20 MG capsule Take 20 mg by mouth daily.       PARoxetine (PAXIL) 20 MG tablet Take 20 mg  by mouth daily. Reported on 05/06/2015     propranolol (INDERAL) 10 MG tablet Take 10 mg by mouth 3 (three) times daily.     traZODone (DESYREL) 50 MG tablet Take 50 mg by mouth daily.     No facility-administered medications prior to visit.     Past Medical History:  Diagnosis Date   Breast cyst, right    Chest pain    Chronic pain in shoulder    GERD (gastroesophageal reflux disease)    History of gastrointestinal bleeding    Hypertension    Low back pain    Varicose veins  painful varicosities left leg      Objective:     BP 103/69   Pulse (!) 101   Ht 5' 2.5" (1.588 m)   Wt 247 lb 9.6 oz (112.3 kg)   SpO2 93% Comment: room air  BMI 44.56 kg/m   SpO2: 93 % (room  air)  Pleasant amb MO (by BMI)  wf nad    HEENT : Oropharynx  clear/ poor dentition      Nasal turbinates nl    NECK :  without  apparent JVD/ palpable Nodes/TM    LUNGS: no acc muscle use,  Nl contour chest which is clear to A and P bilaterally without cough on insp or exp maneuvers   CV:  RRR  no s3 or murmur or increase in P2, and no edema   ABD:  quite obese soft and nontender with slightly reduced excursion supine  MS:  Gait nl   ext warm without deformities Or obvious joint restrictions  calf tenderness, cyanosis or clubbing    SKIN: warm and dry without lesions    NEURO:  alert, approp, nl sensorium with  no motor or cerebellar deficits apparent.         Assessment   Dyspnea Active smoker - 03/09/2023  After extensive coaching inhaler device,  effectiveness =  60% (early trigger, short Ti) > continue air supra prn pending pfts - .03/09/2023   Walked on RA  x  3  lap(s) =  approx 450  ft  @ fast  pace, stopped due to end of study  with lowest 02 sats 91% s sob / cp  - PFTs  03/31/23 >>>  Symptoms are markedly disproportionate to objective findings and not clear to what extent this is actually a pulmonary  problem but pt does appear to have difficult to sort out respiratory symptoms of unknown origin for which  DDX  = almost all start with A and  include Adherence, Ace Inhibitors, Acid Reflux, Active Sinus Disease, Alpha 1 Antitripsin deficiency, Anxiety masquerading as Airways dz,  ABPA,  Allergy(esp in young), Aspiration (esp in elderly), Adverse effects of meds,  Active smoking or Vaping, A bunch of PE's/clot burden (a few small clots can't cause this syndrome unless there is already severe underlying pulm or vascular dz with poor reserve),  Anemia or thyroid disorder, plus two Bs  = Bronchiectasis and Beta blocker use..and one C= CHF    Adherence is always the initial "prime suspect" and is a multilayered concern that requires a "trust but verify" approach in every patient -  starting with knowing how to use medications, especially inhalers, correctly, keeping up with refills and understanding the fundamental difference between maintenance and prns vs those medications only taken for a very short course and then stopped and not refilled.  - see hfa teaching   ? Acid (or non-acid) GERD > always  difficult to exclude as up to 75% of pts in some series report no assoc GI/ Heartburn symptoms> rec continue max (24h)  acid suppression and diet restrictions/ reviewed     ? Allergy asthma > air supra parn up to 2 q 4h prn   ? Aspiration > concerned with poor dentition and body habitus with or without sz is at risk so rec stay off back/ on sides/ continue gerd rx   ? Anxiety/pan or conversion disorder > usually at the bottom of this list of usual suspects   and note already on psychotropics and may interfere with adherence and also interpretation of response or lack thereof to symptom management which can be quite subjective.  >>> f/u neuroplanned  ? Beta blocker effects:  In the setting of respiratory symptoms of unknown etiology,  It would be preferable to use bystolic, the most beta -1  selective Beta blocker available in sample form, with bisoprolol the most selective generic choice  on the market, at least on a trial basis, to make sure the spillover Beta 2 effects of the less specific Beta blockers are not contributing to this patient's symptoms.   F/u with pfts and ov prn    Morbid obesity due to excess calories (HCC) Body mass index is 44.56 kg/m.   No results found for: "TSH"    Contributing to doe and risk of GERD/dvt/ PE  >>>   reviewed the need and the process to achieve and maintain neg calorie balance > defer f/u primary care including intermittently monitoring thyroid status     Cigarette smoker Counseled re importance of smoking cessation but did not meet time criteria for separate billing     F/u is prn based on pfts results   Each maintenance  medication was reviewed in detail including emphasizing most importantly the difference between maintenance and prns and under what circumstances the prns are to be triggered using an action plan format where appropriate.  Total time for H and P, chart review, counseling, reviewing hfa  device(s) , directly observing portions of ambulatory 02 saturation study/ and generating customized AVS unique to this office visit / same day charting = 60 min  new pt with respiratory  symptoms of uncertain etiology               Sandrea Hughs, MD 03/09/2023

## 2023-03-09 ENCOUNTER — Ambulatory Visit: Payer: BC Managed Care – PPO | Admitting: Internal Medicine

## 2023-03-09 ENCOUNTER — Encounter: Payer: Self-pay | Admitting: Internal Medicine

## 2023-03-09 VITALS — BP 103/69 | HR 101 | Ht 62.5 in | Wt 247.6 lb

## 2023-03-09 DIAGNOSIS — F17210 Nicotine dependence, cigarettes, uncomplicated: Secondary | ICD-10-CM | POA: Diagnosis not present

## 2023-03-09 DIAGNOSIS — R0600 Dyspnea, unspecified: Secondary | ICD-10-CM | POA: Insufficient documentation

## 2023-03-09 DIAGNOSIS — R0602 Shortness of breath: Secondary | ICD-10-CM | POA: Diagnosis not present

## 2023-03-09 NOTE — Assessment & Plan Note (Signed)
 Body mass index is 44.56 kg/m.   No results found for: "TSH"    Contributing to doe and risk of GERD/dvt/ PE  >>>   reviewed the need and the process to achieve and maintain neg calorie balance > defer f/u primary care including intermittently monitoring thyroid status

## 2023-03-09 NOTE — Patient Instructions (Addendum)
 Airsupra  2 puffs every 4 hours as needed  (be aware inderol may block the benefit)    Work on inhaler technique:  relax and gently blow all the way out then take a nice smooth full deep breath back in, triggering the inhaler at same time you start breathing in.  Hold breath in for at least  5 seconds if you can. Blow out air supra thru nose. Rinse and gargle with water when done.  If mouth or throat bother you at all,  try brushing teeth/gums/tongue with arm and hammer toothpaste/ make a slurry and gargle and spit out.    Keep appt for your lung functions and I'll call you results within a week with results

## 2023-03-09 NOTE — Assessment & Plan Note (Signed)
 Active smoker - 03/09/2023  After extensive coaching inhaler device,  effectiveness =  60% (early trigger, short Ti) > continue air supra prn pending pfts - .03/09/2023   Walked on RA  x  3  lap(s) =  approx 450  ft  @ fast  pace, stopped due to end of study  with lowest 02 sats 91% s sob / cp  - PFTs  03/31/23 >>>  Symptoms are markedly disproportionate to objective findings and not clear to what extent this is actually a pulmonary  problem but pt does appear to have difficult to sort out respiratory symptoms of unknown origin for which  DDX  = almost all start with A and  include Adherence, Ace Inhibitors, Acid Reflux, Active Sinus Disease, Alpha 1 Antitripsin deficiency, Anxiety masquerading as Airways dz,  ABPA,  Allergy(esp in young), Aspiration (esp in elderly), Adverse effects of meds,  Active smoking or Vaping, A bunch of PE's/clot burden (a few small clots can't cause this syndrome unless there is already severe underlying pulm or vascular dz with poor reserve),  Anemia or thyroid disorder, plus two Bs  = Bronchiectasis and Beta blocker use..and one C= CHF    Adherence is always the initial "prime suspect" and is a multilayered concern that requires a "trust but verify" approach in every patient - starting with knowing how to use medications, especially inhalers, correctly, keeping up with refills and understanding the fundamental difference between maintenance and prns vs those medications only taken for a very short course and then stopped and not refilled.  - see hfa teaching   ? Acid (or non-acid) GERD > always difficult to exclude as up to 75% of pts in some series report no assoc GI/ Heartburn symptoms> rec continue max (24h)  acid suppression and diet restrictions/ reviewed     ? Allergy asthma > air supra parn up to 2 q 4h prn   ? Aspiration > concerned with poor dentition and body habitus with or without sz is at risk so rec stay off back/ on sides/ continue gerd rx   ? Anxiety/pan or  conversion disorder > usually at the bottom of this list of usual suspects   and note already on psychotropics and may interfere with adherence and also interpretation of response or lack thereof to symptom management which can be quite subjective.  >>> f/u neuroplanned  ? Beta blocker effects:  In the setting of respiratory symptoms of unknown etiology,  It would be preferable to use bystolic, the most beta -1  selective Beta blocker available in sample form, with bisoprolol the most selective generic choice  on the market, at least on a trial basis, to make sure the spillover Beta 2 effects of the less specific Beta blockers are not contributing to this patient's symptoms.   F/u with pfts and ov prn

## 2023-03-09 NOTE — Assessment & Plan Note (Signed)
 Counseled re importance of smoking cessation but did not meet time criteria for separate billing     F/u is prn based on pfts results   Each maintenance medication was reviewed in detail including emphasizing most importantly the difference between maintenance and prns and under what circumstances the prns are to be triggered using an action plan format where appropriate.  Total time for H and P, chart review, counseling, reviewing hfa  device(s) , directly observing portions of ambulatory 02 saturation study/ and generating customized AVS unique to this office visit / same day charting = 60 min  new pt with respiratory  symptoms of uncertain etiology

## 2023-03-10 DIAGNOSIS — R7303 Prediabetes: Secondary | ICD-10-CM | POA: Diagnosis not present

## 2023-03-10 DIAGNOSIS — I10 Essential (primary) hypertension: Secondary | ICD-10-CM | POA: Diagnosis not present

## 2023-03-14 DIAGNOSIS — E785 Hyperlipidemia, unspecified: Secondary | ICD-10-CM | POA: Diagnosis not present

## 2023-03-14 DIAGNOSIS — G629 Polyneuropathy, unspecified: Secondary | ICD-10-CM | POA: Diagnosis not present

## 2023-03-14 DIAGNOSIS — Z8669 Personal history of other diseases of the nervous system and sense organs: Secondary | ICD-10-CM | POA: Diagnosis not present

## 2023-03-14 DIAGNOSIS — E1140 Type 2 diabetes mellitus with diabetic neuropathy, unspecified: Secondary | ICD-10-CM | POA: Diagnosis not present

## 2023-03-14 DIAGNOSIS — E1165 Type 2 diabetes mellitus with hyperglycemia: Secondary | ICD-10-CM | POA: Diagnosis not present

## 2023-03-14 DIAGNOSIS — I10 Essential (primary) hypertension: Secondary | ICD-10-CM | POA: Diagnosis not present

## 2023-03-15 ENCOUNTER — Other Ambulatory Visit (HOSPITAL_COMMUNITY): Payer: Self-pay | Admitting: Family Medicine

## 2023-03-15 ENCOUNTER — Encounter (HOSPITAL_COMMUNITY): Payer: Self-pay | Admitting: Family Medicine

## 2023-03-15 DIAGNOSIS — Z1231 Encounter for screening mammogram for malignant neoplasm of breast: Secondary | ICD-10-CM

## 2023-03-15 DIAGNOSIS — Z720 Tobacco use: Secondary | ICD-10-CM

## 2023-03-18 ENCOUNTER — Encounter: Payer: Self-pay | Admitting: Neurology

## 2023-03-18 ENCOUNTER — Encounter (INDEPENDENT_AMBULATORY_CARE_PROVIDER_SITE_OTHER): Payer: Self-pay | Admitting: Neurology

## 2023-03-18 DIAGNOSIS — R569 Unspecified convulsions: Secondary | ICD-10-CM

## 2023-03-18 NOTE — Procedures (Signed)
 Clinical History:  This is a 53 y/o F who presents with events concerning for seizure. Patient reports that the end of August she has been having events of coughing fits then her body will stiffen, she will lose consciousness and jerk.  INTERMITTENT MONITORING with VIDEO TECHNICAL SUMMARY:  This AVEEG was performed using equipment provided by [employer] utilizing Bluetooth ( [employer] ) amplifiers with continuous EEGT attended video collection using encrypted remote transmission via [employer] secured cellular tower network with data rates for each AVEEG performed. This is a Therapist, music AVEEG, obtained, according to the 10-20 international electrode placement system, reformatted digitally into referential and bipolar montages. Data was acquired with a minimum of 21 bipolar connections and sampled at a minimum rate of 250 cycles per second per channel, maximum rate of 450 cycles per second per channel and two channels for EKG. The entire VEEG study was recorded through cable and or radio telemetry for subsequent analysis. Specified epochs of the AVEEG data were identified at the direction of the subject by the depression of a push button by the patient. Each patients event file included data acquired two minutes prior to the push button activation and continuing until two minutes afterwards. AVEEG files were reviewed on [employer] server, Licensed Software provided by [employer] with a digital high frequency filter set at 70 Hz and a low frequency filter set at 1 Hz with a paper speed of 18mm/s resulting in 10 seconds per digital page. This entire AVEEG was reviewed by the EEG Technologist. Random time samples, random sleep samples, clips, patient initiated push button files with included patient daily diary logs, EEG Technologist pruned data was reviewed and verified for accuracy and validity by the governing reading neurologist in full details. This AEEGV was fully compliant with all  requirements for CPT 97500 for setup, patient education, take down and administered by an EEG technologist.   Long-Term EEG with Video was monitored intermittently by a qualified EEG technologist for the entirety of the recording; quality check-ins were performed at a minimum of every two hours, checking and documenting real-time data and video to assure the integrity and quality of the recording (e.g., camera position, electrode integrity and impedance), and identify the need for maintenance. For intermittent monitoring, an EEG Technologist monitored no more than 12 patients concurrently. Diagnostic video was captured at least 80% of the time during the recording.   PATIENT EVENTS:  A button press or notation was not made during this study.   TECHNOLOGIST EVENTS:  No clear epileptiform activity was detected by the reviewing neurodiagnostic technologist during the recording for further evaluation.   TIME SAMPLES:  10-minutes of every two hours recorded are reviewed as random time samples.   SLEEP SAMPLES:  5-minutes of every 24 hour recorded sleep cycle are reviewed as random sleep samples.   AWAKE:  At maximal level of alertness, the posterior dominant background activity was continuous, reactive, low voltage rhythm of 10 Hz. This was symmetric, well-modulated, and attenuated with eye opening. Diffuse, symmetric, frontocentral beta range activity was present.  SLEEP:  N1 Sleep (Stage 1) was observed and characterized by the disappearance of alpha rhythm and the appearance of vertex activity.  N2 Sleep (Stage 2) was observed and characterized by vertex waves, K-complexes, and sleep spindles.  N3 (Stage 3) sleep was observed and characterized by high amplitude Delta activity of 20%.  REM sleep was observed.   EKG: There were no arrhythmias or abnormalities noted during this recording.  Impression:  Normal EEG: awake and asleep   Clinical Correlation:  This is a normal 3-day  ambulatory EEG tracing. No focal abnormalities or epileptiform discharges were seen. There were no electrographic seizures noted. No events were captured during the recording.   Windell Norfolk, MD [facility]

## 2023-03-28 ENCOUNTER — Ambulatory Visit (HOSPITAL_COMMUNITY)

## 2023-03-31 ENCOUNTER — Ambulatory Visit (HOSPITAL_COMMUNITY)
Admission: RE | Admit: 2023-03-31 | Discharge: 2023-03-31 | Disposition: A | Discharge status: Home / Self Care | Payer: BC Managed Care – PPO | Source: Ambulatory Visit | Attending: Internal Medicine | Admitting: Internal Medicine

## 2023-03-31 DIAGNOSIS — R059 Cough, unspecified: Secondary | ICD-10-CM | POA: Diagnosis not present

## 2023-03-31 LAB — PULMONARY FUNCTION TEST
DL/VA % pred: 114 %
DL/VA: 4.92 ml/min/mmHg/L
DLCO unc % pred: 94 %
DLCO unc: 19.07 ml/min/mmHg
FEF 25-75 Post: 1.04 L/s
FEF 25-75 Pre: 1.3 L/s
FEF2575-%Change-Post: -20 %
FEF2575-%Pred-Post: 39 %
FEF2575-%Pred-Pre: 49 %
FEV1-%Change-Post: -13 %
FEV1-%Pred-Post: 57 %
FEV1-%Pred-Pre: 65 %
FEV1-Post: 1.52 L
FEV1-Pre: 1.75 L
FEV1FVC-%Change-Post: -17 %
FEV1FVC-%Pred-Pre: 86 %
FEV6-%Change-Post: 4 %
FEV6-%Pred-Post: 81 %
FEV6-%Pred-Pre: 77 %
FEV6-Post: 2.66 L
FEV6-Pre: 2.54 L
FEV6FVC-%Change-Post: 0 %
FEV6FVC-%Pred-Post: 102 %
FEV6FVC-%Pred-Pre: 102 %
FVC-%Change-Post: 5 %
FVC-%Pred-Post: 79 %
FVC-%Pred-Pre: 75 %
FVC-Post: 2.68 L
FVC-Pre: 2.54 L
Post FEV1/FVC ratio: 57 %
Post FEV6/FVC ratio: 99 %
Pre FEV1/FVC ratio: 69 %
Pre FEV6/FVC Ratio: 100 %
RV % pred: 114 %
RV: 2.04 L
TLC % pred: 100 %
TLC: 4.92 L

## 2023-03-31 MED ORDER — ALBUTEROL SULFATE (2.5 MG/3ML) 0.083% IN NEBU
2.5000 mg | INHALATION_SOLUTION | Freq: Once | RESPIRATORY_TRACT | Status: AC
  Administered 2023-03-31: 2.5 mg via RESPIRATORY_TRACT

## 2023-04-04 ENCOUNTER — Inpatient Hospital Stay (HOSPITAL_COMMUNITY): Admission: RE | Admit: 2023-04-04 | Source: Ambulatory Visit

## 2023-04-27 ENCOUNTER — Ambulatory Visit (HOSPITAL_COMMUNITY)
Admission: RE | Admit: 2023-04-27 | Discharge: 2023-04-27 | Disposition: A | Discharge status: Home / Self Care | Source: Ambulatory Visit | Attending: Family Medicine | Admitting: Family Medicine

## 2023-04-27 DIAGNOSIS — Z720 Tobacco use: Secondary | ICD-10-CM | POA: Diagnosis not present

## 2023-04-27 DIAGNOSIS — F17210 Nicotine dependence, cigarettes, uncomplicated: Secondary | ICD-10-CM | POA: Diagnosis not present

## 2023-07-14 DIAGNOSIS — I10 Essential (primary) hypertension: Secondary | ICD-10-CM | POA: Diagnosis not present

## 2023-07-14 DIAGNOSIS — E1165 Type 2 diabetes mellitus with hyperglycemia: Secondary | ICD-10-CM | POA: Diagnosis not present

## 2023-07-26 DIAGNOSIS — E1165 Type 2 diabetes mellitus with hyperglycemia: Secondary | ICD-10-CM | POA: Diagnosis not present

## 2023-07-26 DIAGNOSIS — E785 Hyperlipidemia, unspecified: Secondary | ICD-10-CM | POA: Diagnosis not present

## 2023-07-26 DIAGNOSIS — Z8669 Personal history of other diseases of the nervous system and sense organs: Secondary | ICD-10-CM | POA: Diagnosis not present

## 2023-07-26 DIAGNOSIS — I10 Essential (primary) hypertension: Secondary | ICD-10-CM | POA: Diagnosis not present

## 2023-07-26 DIAGNOSIS — G629 Polyneuropathy, unspecified: Secondary | ICD-10-CM | POA: Diagnosis not present

## 2023-09-06 ENCOUNTER — Ambulatory Visit: Admitting: Internal Medicine

## 2023-09-30 ENCOUNTER — Encounter: Payer: Self-pay | Admitting: Internal Medicine

## 2023-09-30 ENCOUNTER — Telehealth: Payer: Self-pay | Admitting: Internal Medicine

## 2023-09-30 ENCOUNTER — Ambulatory Visit: Attending: Internal Medicine | Admitting: Internal Medicine

## 2023-09-30 ENCOUNTER — Ambulatory Visit: Attending: Internal Medicine

## 2023-09-30 ENCOUNTER — Other Ambulatory Visit: Payer: Self-pay | Admitting: Internal Medicine

## 2023-09-30 VITALS — BP 112/78 | HR 80 | Ht 63.0 in | Wt 241.8 lb

## 2023-09-30 DIAGNOSIS — R55 Syncope and collapse: Secondary | ICD-10-CM

## 2023-09-30 DIAGNOSIS — I10 Essential (primary) hypertension: Secondary | ICD-10-CM

## 2023-09-30 NOTE — Telephone Encounter (Signed)
 Patient stated she had her heart monitor placed today and wants to know if the green light is supposed to stay on.

## 2023-09-30 NOTE — Progress Notes (Signed)
 Cardiology Office Note  Date: 09/30/2023   ID: Yaneliz, Radebaugh 06/25/1970, MRN [id number]  PCP:  Hyacinth Honey, NP  Cardiologist:  Diannah SHAUNNA Maywood, MD Electrophysiologist:  None   History of Present Illness: Khiana Camino is a 53 y.o. female  Referred to cardiology clinic for evaluation of syncope.  Patient had syncope starting from August 2024 where she had seizure-like activity and postictal confusion.  She was seen by neurology, EEG was unremarkable.  She had 20-40 syncopal events since August 2024.  She does not have any warning signs of dizziness or palpitations prior to syncopal event.  Sometimes she gets cough followed by syncope but not always.  She has hot flashes but this has been going on for quite a while even before the first syncopal episode ever started.  Never had any chest pain, DOE.  No dizziness, palpitations, leg swelling.  Past Medical History:  Diagnosis Date   Breast cyst, right    Chest pain    Chronic pain in shoulder    GERD (gastroesophageal reflux disease)    History of gastrointestinal bleeding    Hypertension    Low back pain    Varicose veins  painful varicosities left leg    Past Surgical History:  Procedure Laterality Date   ANTERIOR CRUCIATE LIGAMENT REPAIR  right knee  1999   COLONOSCOPY N/A 05/23/2015   Procedure: COLONOSCOPY;  Surgeon: Claudis RAYMOND Rivet, MD;  Location: [facility];  Service: Endoscopy;  Laterality: N/A;  1:10   DILATION AND CURETTAGE OF UTERUS     KNEE CARTILAGE SURGERY  right knee 1987   right knee 1999   TUBAL LIGATION  1997   uterine ablation      Current Outpatient Medications  Medication Sig Dispense Refill   acetaminophen  (TYLENOL ) 500 MG tablet Take 500 mg by mouth every 6 (six) hours as needed for mild pain (pain score 1-3), moderate pain (pain score 4-6), headache or fever.     amLODipine (NORVASC) 10 MG tablet Take 10 mg by mouth daily.     atorvastatin (LIPITOR) 10 MG tablet Take 10  mg by mouth daily.     Coenzyme Q10 300 MG CAPS Take 1 capsule by mouth daily.     gabapentin (NEURONTIN) 300 MG capsule Take 300 mg by mouth 3 (three) times daily.     meloxicam (MOBIC) 15 MG tablet Take 15 mg by mouth daily.     Multiple Vitamins-Minerals (MULTI-VITAMIN MENOPAUSAL PO) Take 2 tablets by mouth daily.     olmesartan-hydrochlorothiazide (BENICAR HCT) 40-12.5 MG tablet Take 1 tablet by mouth daily.     omeprazole (PRILOSEC) 20 MG capsule Take 20 mg by mouth daily.       OZEMPIC, 0.25 OR 0.5 MG/DOSE, 2 MG/3ML SOPN Inject 0.5 mg into the skin once a week.     PARoxetine (PAXIL) 40 MG tablet Take 40 mg by mouth daily. Reported on 05/06/2015     propranolol (INDERAL) 10 MG tablet Take 10 mg by mouth 3 (three) times daily. (Patient taking differently: Take 10 mg by mouth 2 (two) times daily.)     traZODone (DESYREL) 50 MG tablet Take 50 mg by mouth daily.     No current facility-administered medications for this visit.   Allergies:  Patient has no known allergies.   Social History: The patient  reports that she has been smoking cigarettes. She has a 12.5 pack-year smoking history. She uses smokeless tobacco. She reports current alcohol use.  She reports that she does not use drugs.   Family History: The patient's family history includes Cancer in her maternal grandmother.   ROS:  Please see the history of present illness. Otherwise, complete review of systems is positive for none  All other systems are reviewed and negative.   Physical Exam: VS:  BP 112/78   Pulse 80   Ht 5' 3 (1.6 m)   Wt 241 lb 12.8 oz (109.7 kg)   SpO2 96%   BMI 42.83 kg/m , BMI Body mass index is 42.83 kg/m.  Wt Readings from Last 3 Encounters:  09/30/23 241 lb 12.8 oz (109.7 kg)  03/09/23 247 lb 9.6 oz (112.3 kg)  01/20/23 251 lb 8 oz (114.1 kg)    General: Patient appears comfortable at rest. HEENT: Conjunctiva and lids normal, oropharynx clear with moist mucosa. Neck: Supple, no elevated JVP or  carotid bruits, no thyromegaly. Lungs: Clear to auscultation, nonlabored breathing at rest. Cardiac: Regular rate and rhythm, no S3 or significant systolic murmur, no pericardial rub. Abdomen: Soft, nontender, no hepatomegaly, bowel sounds present, no guarding or rebound. Extremities: No pitting edema, distal pulses 2+. Skin: Warm and dry. Musculoskeletal: No kyphosis. Neuropsychiatric: Alert and oriented x3, affect grossly appropriate.  Recent Labwork: No results found for requested labs within last 365 days.  No results found for: CHOL, TRIG, HDL, CHOLHDL, VLDL, LDLCALC, LDLDIRECT   Assessment and Plan:   Syncope - Patient had 20-40 episodes of syncope since August 2024.  No warning signs.  No chest pain, dizziness/palpitations prior to syncope.  She had seizure-like activity in August 2024 with postictal confusion.  Neurology on board, EEG normal.  She reported having postictal confusion a few times since then. - Obtain echocardiogram, 2-week event monitor, live and Lexiscan. - If event monitor is unremarkable, she would benefit from loop recorder placement. - No driving until etiology is found or has no recurrence for at least 6 months.  HTN, controlled - Continue amlodipine 10 mg once daily, olmesartan-HCTZ 40-12.5 mg once daily.  40 minutes spent in reviewing the prior records, more than 3 labs, discussion of the problems with the patient, documentation and answering all her questions.  Medication Adjustments/Labs and Tests Ordered: Current medicines are reviewed at length with the patient today.  Concerns regarding medicines are outlined above.    Disposition:  Follow up 3 months  Signed Oisin Yoakum Priya Severus Brodzinski, MD, 09/30/2023 11:05 AM    [facility] [address] [place], [place], [place] [address]

## 2023-09-30 NOTE — Telephone Encounter (Signed)
 Checking percert on the following patient for testing scheduled at [facility].    LEXISCAN  10-21-23  2 week monitor placed Dx: Syncope

## 2023-09-30 NOTE — Patient Instructions (Signed)
 Medication Instructions:  Your physician recommends that you continue on your current medications as directed. Please refer to the Current Medication list given to you today.   Labwork: None  Testing/Procedures: Your physician has requested that you have an echocardiogram. Echocardiography is a painless test that uses sound waves to create images of your heart. It provides your doctor with information about the size and shape of your heart and how well your heart's chambers and valves are working. This procedure takes approximately one hour. There are no restrictions for this procedure. Please do NOT wear cologne, perfume, aftershave, or lotions (deodorant is allowed). Please arrive 15 minutes prior to your appointment time.  Please note: We ask at that you not bring children with you during ultrasound (echo/ vascular) testing. Due to room size and safety concerns, children are not allowed in the ultrasound rooms during exams. Our front office staff cannot provide observation of children in our lobby area while testing is being conducted. An adult accompanying a patient to their appointment will only be allowed in the ultrasound room at the discretion of the ultrasound technician under special circumstances. We apologize for any inconvenience.  Your physician has requested that you have a lexiscan myoview. For further information please visit [email or web address] Please follow instruction sheet, as given.  Your physician has recommended that you wear a Zio monitor.   This monitor is a medical device that records the heart's electrical activity. Doctors most often use these monitors to diagnose arrhythmias. Arrhythmias are problems with the speed or rhythm of the heartbeat. The monitor is a small device applied to your chest. You can wear one while you do your normal daily activities. While wearing this monitor if you have any symptoms to push the button and record what you felt. Once you have worn  this monitor for the period of time provider prescribed (for 14 days), you will return the monitor device in the postage paid box. Once it is returned they will download the data collected and provide us  with a report which the provider will then review and we will call you with those results. Important tips:  Avoid showering during the first 24 hours of wearing the monitor. Avoid excessive sweating to help maximize wear time. Do not submerge the device, no hot tubs, and no swimming pools. Keep any lotions or oils away from the patch. After 24 hours you may shower with the patch on. Take brief showers with your back facing the shower head.  Do not remove patch once it has been placed because that will interrupt data and decrease adhesive wear time. Push the button when you have any symptoms and write down what you were feeling. Once you have completed wearing your monitor, remove and place into box which has postage paid and place in your outgoing mailbox.  If for some reason you have misplaced your box then call our office and we can provide another box and/or mail it off for you.   Follow-Up: Your physician recommends that you schedule a follow-up appointment in: 3 months  Any Other Special Instructions Will Be Listed Below (If Applicable). Thank you for choosing [facility]!     If you need a refill on your cardiac medications before your next appointment, please call your pharmacy.

## 2023-09-30 NOTE — Telephone Encounter (Signed)
 Patient wanted to verify whether any lights where supposed to stay on advised her that it wasn't, but if she has any further questions with the monitor she will need call [employer] for assistance. The number is located in the box she was given. No further issues at this time

## 2023-10-01 DIAGNOSIS — R55 Syncope and collapse: Secondary | ICD-10-CM | POA: Diagnosis not present

## 2023-10-17 ENCOUNTER — Ambulatory Visit: Attending: Internal Medicine

## 2023-10-17 DIAGNOSIS — R55 Syncope and collapse: Secondary | ICD-10-CM

## 2023-10-17 LAB — ECHOCARDIOGRAM COMPLETE
AR max vel: 3.55 cm2
AV Area VTI: 3.46 cm2
AV Area mean vel: 3.38 cm2
AV Mean grad: 5 mmHg
AV Peak grad: 10.9 mmHg
Ao pk vel: 1.65 m/s
Area-P 1/2: 4.63 cm2
Calc EF: 65.2 %
MV VTI: 4.77 cm2
S' Lateral: 2.7 cm
Single Plane A2C EF: 64 %
Single Plane A4C EF: 68.8 %

## 2023-10-21 ENCOUNTER — Encounter (HOSPITAL_COMMUNITY)
Admission: RE | Admit: 2023-10-21 | Discharge: 2023-10-21 | Disposition: A | Discharge status: Home / Self Care | Source: Ambulatory Visit | Attending: Internal Medicine | Admitting: Internal Medicine

## 2023-10-21 ENCOUNTER — Telehealth: Payer: Self-pay | Admitting: Student

## 2023-10-21 ENCOUNTER — Ambulatory Visit (HOSPITAL_COMMUNITY)
Admission: RE | Admit: 2023-10-21 | Discharge: 2023-10-21 | Disposition: A | Discharge status: Home / Self Care | Source: Ambulatory Visit | Attending: Internal Medicine | Admitting: Internal Medicine

## 2023-10-21 DIAGNOSIS — R55 Syncope and collapse: Secondary | ICD-10-CM | POA: Diagnosis not present

## 2023-10-21 LAB — NM MYOCAR MULTI W/SPECT W/WALL MOTION / EF
Estimated workload: 1
Exercise duration (min): 0 min
Exercise duration (sec): 0 s
LV dias vol: 60 mL (ref 46–106)
LV sys vol: 14 mL (ref 3.8–5.2)
MPHR: 168 {beats}/min
Nuc Stress EF: 76 %
Peak HR: 105 {beats}/min
Percent HR: 62 %
RATE: 0.4
Rest HR: 85 {beats}/min
Rest Nuclear Isotope Dose: 10.4 mCi
SDS: 3
SRS: 3
SSS: 6
ST Depression (mm): 0 mm
Stress Nuclear Isotope Dose: 31.2 mCi
TID: 1.03

## 2023-10-21 MED ORDER — SODIUM CHLORIDE FLUSH 0.9 % IV SOLN
INTRAVENOUS | Status: AC
  Administered 2023-10-21: 10 mL via INTRAVENOUS
  Filled 2023-10-21: qty 10

## 2023-10-21 MED ORDER — TECHNETIUM TC 99M TETROFOSMIN IV KIT
10.4000 | PACK | Freq: Once | INTRAVENOUS | Status: AC | PRN
  Administered 2023-10-21: 10.4 via INTRAVENOUS

## 2023-10-21 MED ORDER — REGADENOSON 0.4 MG/5ML IV SOLN
INTRAVENOUS | Status: AC
  Administered 2023-10-21: 0.4 mg via INTRAVENOUS
  Filled 2023-10-21: qty 5

## 2023-10-21 MED ORDER — TECHNETIUM TC 99M TETROFOSMIN IV KIT
31.2000 | PACK | Freq: Once | INTRAVENOUS | Status: AC | PRN
  Administered 2023-10-21: 31.2 via INTRAVENOUS

## 2023-10-21 NOTE — Telephone Encounter (Signed)
     Kimberly Fitzpatrick presented for a Lexiscan nuclear stress test today.  I Laymon CHRISTELLA Qua, PA-C, provided direct supervision and was present during the stress portion of the study today, which was completed without significant symptoms, immediate complications, or acute ST/T changes on ECG.  Stress imaging is pending at this time.  Preliminary ECG findings may be listed in the chart, but the stress test result will not be finalized until perfusion imaging is complete.  Laymon CHRISTELLA Qua, PA-C  10/21/2023, 10:22 AM

## 2023-10-28 ENCOUNTER — Ambulatory Visit: Payer: Self-pay | Admitting: Internal Medicine

## 2023-10-31 DIAGNOSIS — R55 Syncope and collapse: Secondary | ICD-10-CM

## 2023-11-18 ENCOUNTER — Ambulatory Visit: Payer: Self-pay | Admitting: Internal Medicine

## 2023-11-21 NOTE — Telephone Encounter (Signed)
-----   Message from Vishnu P Mallipeddi sent at 11/18/2023 11:24 AM EST ----- Average HR 81 bpm.  Normal event monitor.  Symptoms correlated with NSR. ----- Message ----- From: Stacia Diannah SQUIBB, MD Sent: 10/31/2023   1:34 PM EST To: Vishnu P Mallipeddi, MD

## 2023-11-21 NOTE — Telephone Encounter (Signed)
 The patient has been notified of the result and verbalized understanding.  All questions (if any) were answered. Littie CHRISTELLA Croak, CMA 11/21/2023 1:35 PM

## 2023-11-22 ENCOUNTER — Encounter: Payer: Self-pay | Admitting: Internal Medicine

## 2023-12-19 ENCOUNTER — Encounter: Payer: Self-pay | Admitting: Internal Medicine

## 2023-12-23 ENCOUNTER — Ambulatory Visit: Admitting: Internal Medicine
